# Patient Record
Sex: Male | Born: 1955 | Hispanic: No | Marital: Married | Smoking: Former smoker
Health system: Southern US, Community
[De-identification: ages and names within clinical notes are randomized; demographics above are authoritative.]

## PROBLEM LIST (undated history)

## (undated) DIAGNOSIS — K219 Gastro-esophageal reflux disease without esophagitis: Secondary | ICD-10-CM

## (undated) DIAGNOSIS — D6859 Other primary thrombophilia: Secondary | ICD-10-CM

## (undated) HISTORY — PX: ANAL FISSURE REPAIR: SHX2312

## (undated) HISTORY — PX: COLONOSCOPY: SHX174

---

## 1994-04-13 DIAGNOSIS — I82409 Acute embolism and thrombosis of unspecified deep veins of unspecified lower extremity: Secondary | ICD-10-CM

## 1994-04-13 HISTORY — DX: Acute embolism and thrombosis of unspecified deep veins of unspecified lower extremity: I82.409

## 2004-12-16 ENCOUNTER — Ambulatory Visit: Payer: Self-pay | Admitting: Internal Medicine

## 2004-12-18 ENCOUNTER — Ambulatory Visit: Payer: Self-pay | Admitting: Oncology

## 2004-12-24 ENCOUNTER — Ambulatory Visit: Payer: Self-pay | Admitting: Cardiology

## 2005-01-08 ENCOUNTER — Ambulatory Visit: Payer: Self-pay | Admitting: Cardiology

## 2005-01-20 ENCOUNTER — Ambulatory Visit: Admission: RE | Admit: 2005-01-20 | Discharge: 2005-01-20 | Payer: Self-pay | Admitting: Oncology

## 2005-02-09 ENCOUNTER — Ambulatory Visit: Payer: Self-pay | Admitting: Oncology

## 2005-03-02 ENCOUNTER — Ambulatory Visit: Payer: Self-pay

## 2005-03-20 ENCOUNTER — Ambulatory Visit: Payer: Self-pay | Admitting: Cardiology

## 2005-04-23 ENCOUNTER — Ambulatory Visit: Payer: Self-pay | Admitting: Cardiology

## 2005-05-05 ENCOUNTER — Ambulatory Visit: Payer: Self-pay | Admitting: Oncology

## 2005-06-30 ENCOUNTER — Ambulatory Visit: Payer: Self-pay | Admitting: Oncology

## 2005-07-14 LAB — CBC WITH DIFFERENTIAL/PLATELET
BASO%: 0.8 % (ref 0.0–2.0)
Basophils Absolute: 0 10*3/uL (ref 0.0–0.1)
EOS%: 3.6 % (ref 0.0–7.0)
HCT: 43.3 % (ref 38.7–49.9)
MCH: 30.7 pg (ref 28.0–33.4)
MCHC: 35.3 g/dL (ref 32.0–35.9)
MCV: 86.9 fL (ref 81.6–98.0)
MONO%: 6.2 % (ref 0.0–13.0)
NEUT%: 59.2 % (ref 40.0–75.0)
lymph#: 1.9 10*3/uL (ref 0.9–3.3)

## 2005-07-14 LAB — PROTIME-INR

## 2005-07-28 LAB — PROTIME-INR

## 2005-08-21 ENCOUNTER — Ambulatory Visit: Payer: Self-pay | Admitting: Oncology

## 2005-08-25 LAB — CBC WITH DIFFERENTIAL/PLATELET
BASO%: 0.3 % (ref 0.0–2.0)
EOS%: 0.8 % (ref 0.0–7.0)
HCT: 44.2 % (ref 38.7–49.9)
LYMPH%: 22.8 % (ref 14.0–48.0)
MCH: 30.4 pg (ref 28.0–33.4)
MCHC: 34.8 g/dL (ref 32.0–35.9)
MONO#: 0.3 10*3/uL (ref 0.1–0.9)
NEUT%: 71.4 % (ref 40.0–75.0)
Platelets: 206 10*3/uL (ref 145–400)
RBC: 5.05 10*6/uL (ref 4.20–5.71)
WBC: 7.3 10*3/uL (ref 4.0–10.0)
lymph#: 1.7 10*3/uL (ref 0.9–3.3)

## 2005-08-25 LAB — PROTIME-INR: Protime: 23.6 Seconds — ABNORMAL HIGH (ref 10.6–13.4)

## 2005-09-14 LAB — PROTIME-INR: INR: 3.4 (ref 2.00–3.50)

## 2005-09-14 LAB — CBC WITH DIFFERENTIAL/PLATELET
BASO%: 1.1 % (ref 0.0–2.0)
Eosinophils Absolute: 0.1 10*3/uL (ref 0.0–0.5)
HCT: 45.2 % (ref 38.7–49.9)
HGB: 15.4 g/dL (ref 13.0–17.1)
MCHC: 34.2 g/dL (ref 32.0–35.9)
MONO#: 0.3 10*3/uL (ref 0.1–0.9)
NEUT#: 3.3 10*3/uL (ref 1.5–6.5)
NEUT%: 60.4 % (ref 40.0–75.0)
WBC: 5.4 10*3/uL (ref 4.0–10.0)
lymph#: 1.6 10*3/uL (ref 0.9–3.3)

## 2005-09-18 LAB — PROTIME-INR: Protime: 21 Seconds — ABNORMAL HIGH (ref 10.6–13.4)

## 2005-10-12 ENCOUNTER — Ambulatory Visit: Payer: Self-pay | Admitting: Oncology

## 2005-10-20 LAB — CBC WITH DIFFERENTIAL/PLATELET
BASO%: 0.5 % (ref 0.0–2.0)
Basophils Absolute: 0 10*3/uL (ref 0.0–0.1)
EOS%: 1 % (ref 0.0–7.0)
Eosinophils Absolute: 0.1 10*3/uL (ref 0.0–0.5)
HCT: 43.1 % (ref 38.7–49.9)
HGB: 14.8 g/dL (ref 13.0–17.1)
LYMPH%: 31.3 % (ref 14.0–48.0)
MCHC: 34.4 g/dL (ref 32.0–35.9)
MONO#: 0.3 10*3/uL (ref 0.1–0.9)
MONO%: 5.2 % (ref 0.0–13.0)
NEUT#: 3.5 10*3/uL (ref 1.5–6.5)
NEUT%: 62 % (ref 40.0–75.0)
Platelets: 175 10*3/uL (ref 145–400)
WBC: 5.7 10*3/uL (ref 4.0–10.0)

## 2005-10-20 LAB — PROTIME-INR
INR: 3.2 (ref 2.00–3.50)
Protime: 21.6 Seconds — ABNORMAL HIGH (ref 10.6–13.4)

## 2005-11-09 LAB — PROTIME-INR

## 2005-11-16 LAB — PROTIME-INR
INR: 2.3 (ref 2.00–3.50)
Protime: 18.4 Seconds — ABNORMAL HIGH (ref 10.6–13.4)

## 2005-11-20 LAB — CBC WITH DIFFERENTIAL/PLATELET
BASO%: 0.8 % (ref 0.0–2.0)
Eosinophils Absolute: 0.1 10*3/uL (ref 0.0–0.5)
LYMPH%: 28.9 % (ref 14.0–48.0)
MCHC: 34.8 g/dL (ref 32.0–35.9)
MONO#: 0.4 10*3/uL (ref 0.1–0.9)
NEUT#: 3.9 10*3/uL (ref 1.5–6.5)
Platelets: 191 10*3/uL (ref 145–400)
RBC: 4.96 10*6/uL (ref 4.20–5.71)
RDW: 10.8 % — ABNORMAL LOW (ref 11.2–14.6)
WBC: 6.3 10*3/uL (ref 4.0–10.0)
lymph#: 1.8 10*3/uL (ref 0.9–3.3)

## 2005-11-20 LAB — PROTIME-INR
INR: 2.2 (ref 2.00–3.50)
Protime: 18.3 Seconds (ref 10.6–13.4)

## 2005-12-18 ENCOUNTER — Ambulatory Visit: Payer: Self-pay | Admitting: Oncology

## 2005-12-18 LAB — CBC WITH DIFFERENTIAL/PLATELET
BASO%: 1.7 % (ref 0.0–2.0)
LYMPH%: 27.8 % (ref 14.0–48.0)
MCHC: 35.8 g/dL (ref 32.0–35.9)
MCV: 85.2 fL (ref 81.6–98.0)
MONO#: 0.4 10*3/uL (ref 0.1–0.9)
MONO%: 6 % (ref 0.0–13.0)
Platelets: 185 10*3/uL (ref 145–400)
RBC: 5.16 10*6/uL (ref 4.20–5.71)
RDW: 10.8 % — ABNORMAL LOW (ref 11.2–14.6)
WBC: 6.1 10*3/uL (ref 4.0–10.0)

## 2006-02-10 ENCOUNTER — Ambulatory Visit: Payer: Self-pay | Admitting: Oncology

## 2006-02-17 LAB — CBC WITH DIFFERENTIAL/PLATELET
Basophils Absolute: 0.1 10*3/uL (ref 0.0–0.1)
Eosinophils Absolute: 0.2 10*3/uL (ref 0.0–0.5)
HGB: 15.2 g/dL (ref 13.0–17.1)
MCV: 84.8 fL (ref 81.6–98.0)
MONO#: 0.4 10*3/uL (ref 0.1–0.9)
MONO%: 7.4 % (ref 0.0–13.0)
NEUT#: 2.9 10*3/uL (ref 1.5–6.5)
RBC: 5.03 10*6/uL (ref 4.20–5.71)
RDW: 10.7 % — ABNORMAL LOW (ref 11.2–14.6)
WBC: 5.5 10*3/uL (ref 4.0–10.0)
lymph#: 1.9 10*3/uL (ref 0.9–3.3)

## 2006-02-17 LAB — PROTIME-INR

## 2006-03-17 LAB — CBC WITH DIFFERENTIAL/PLATELET
Basophils Absolute: 0 10*3/uL (ref 0.0–0.1)
Eosinophils Absolute: 0.2 10*3/uL (ref 0.0–0.5)
HGB: 15.6 g/dL (ref 13.0–17.1)
LYMPH%: 29.5 % (ref 14.0–48.0)
MCV: 87.3 fL (ref 81.6–98.0)
MONO#: 0.3 10*3/uL (ref 0.1–0.9)
MONO%: 5.4 % (ref 0.0–13.0)
NEUT#: 3.4 10*3/uL (ref 1.5–6.5)
Platelets: 188 10*3/uL (ref 145–400)
RDW: 12.5 % (ref 11.2–14.6)
WBC: 5.5 10*3/uL (ref 4.0–10.0)

## 2006-03-17 LAB — PROTIME-INR: INR: 2.2 (ref 2.00–3.50)

## 2006-04-04 ENCOUNTER — Ambulatory Visit: Payer: Self-pay | Admitting: Oncology

## 2006-04-08 LAB — CBC WITH DIFFERENTIAL/PLATELET
Basophils Absolute: 0.1 10*3/uL (ref 0.0–0.1)
Eosinophils Absolute: 0.1 10*3/uL (ref 0.0–0.5)
HGB: 16.1 g/dL (ref 13.0–17.1)
MCV: 84.7 fL (ref 81.6–98.0)
MONO#: 0.5 10*3/uL (ref 0.1–0.9)
MONO%: 6.3 % (ref 0.0–13.0)
NEUT#: 5 10*3/uL (ref 1.5–6.5)
RBC: 5.29 10*6/uL (ref 4.20–5.71)
RDW: 10.6 % — ABNORMAL LOW (ref 11.2–14.6)
WBC: 7.7 10*3/uL (ref 4.0–10.0)

## 2006-04-08 LAB — PROTIME-INR
INR: 1.8 — ABNORMAL LOW (ref 2.00–3.50)
Protime: 21.6 Seconds — ABNORMAL HIGH (ref 10.6–13.4)

## 2006-04-16 LAB — PROTIME-INR
INR: 1.7 — ABNORMAL LOW (ref 2.00–3.50)
Protime: 20.4 Seconds — ABNORMAL HIGH (ref 10.6–13.4)

## 2006-04-30 LAB — PROTIME-INR
INR: 2 (ref 2.00–3.50)
Protime: 24 Seconds — ABNORMAL HIGH (ref 10.6–13.4)

## 2006-05-25 ENCOUNTER — Ambulatory Visit: Payer: Self-pay | Admitting: Oncology

## 2006-05-28 LAB — CBC WITH DIFFERENTIAL/PLATELET
BASO%: 0.9 % (ref 0.0–2.0)
EOS%: 2.3 % (ref 0.0–7.0)
MCH: 31 pg (ref 28.0–33.4)
MCHC: 35.9 g/dL (ref 32.0–35.9)
NEUT%: 57.6 % (ref 40.0–75.0)
RDW: 10.4 % — ABNORMAL LOW (ref 11.2–14.6)
lymph#: 2.2 10*3/uL (ref 0.9–3.3)

## 2006-05-28 LAB — PROTIME-INR

## 2006-07-16 ENCOUNTER — Ambulatory Visit: Payer: Self-pay | Admitting: Oncology

## 2006-07-16 LAB — CBC WITH DIFFERENTIAL/PLATELET
BASO%: 1.2 % (ref 0.0–2.0)
Eosinophils Absolute: 0.3 10*3/uL (ref 0.0–0.5)
HCT: 46.6 % (ref 38.7–49.9)
LYMPH%: 35.1 % (ref 14.0–48.0)
MCHC: 34.9 g/dL (ref 32.0–35.9)
MONO#: 0.5 10*3/uL (ref 0.1–0.9)
NEUT%: 50.4 % (ref 40.0–75.0)
Platelets: 197 10*3/uL (ref 145–400)
WBC: 5.9 10*3/uL (ref 4.0–10.0)

## 2006-08-20 LAB — CBC WITH DIFFERENTIAL/PLATELET
EOS%: 3.5 % (ref 0.0–7.0)
MCH: 29.8 pg (ref 28.0–33.4)
MCV: 84.9 fL (ref 81.6–98.0)
MONO%: 5.8 % (ref 0.0–13.0)
NEUT#: 2.4 10*3/uL (ref 1.5–6.5)
RBC: 5.15 10*6/uL (ref 4.20–5.71)
RDW: 10.2 % — ABNORMAL LOW (ref 11.2–14.6)

## 2006-08-20 LAB — PROTIME-INR
INR: 3.3 (ref 2.00–3.50)
Protime: 39.6 Seconds — ABNORMAL HIGH (ref 10.6–13.4)

## 2006-09-01 ENCOUNTER — Ambulatory Visit: Payer: Self-pay | Admitting: Oncology

## 2006-09-24 LAB — CBC WITH DIFFERENTIAL/PLATELET
BASO%: 1.1 % (ref 0.0–2.0)
Basophils Absolute: 0.1 10*3/uL (ref 0.0–0.1)
HCT: 41.5 % (ref 38.7–49.9)
HGB: 15 g/dL (ref 13.0–17.1)
MONO#: 0.4 10*3/uL (ref 0.1–0.9)
NEUT%: 57.8 % (ref 40.0–75.0)
WBC: 5.1 10*3/uL (ref 4.0–10.0)
lymph#: 1.6 10*3/uL (ref 0.9–3.3)

## 2006-09-24 LAB — PROTIME-INR

## 2006-10-20 ENCOUNTER — Ambulatory Visit: Payer: Self-pay | Admitting: Oncology

## 2006-10-22 LAB — CBC WITH DIFFERENTIAL/PLATELET
Basophils Absolute: 0 10*3/uL (ref 0.0–0.1)
EOS%: 3.1 % (ref 0.0–7.0)
Eosinophils Absolute: 0.2 10*3/uL (ref 0.0–0.5)
HCT: 45.1 % (ref 38.7–49.9)
HGB: 15.8 g/dL (ref 13.0–17.1)
MCH: 30.5 pg (ref 28.0–33.4)
MCV: 86.9 fL (ref 81.6–98.0)
NEUT%: 64.6 % (ref 40.0–75.0)
lymph#: 1.6 10*3/uL (ref 0.9–3.3)

## 2006-10-22 LAB — PROTIME-INR: INR: 2.1 (ref 2.00–3.50)

## 2006-11-23 LAB — CBC WITH DIFFERENTIAL/PLATELET
Basophils Absolute: 0.1 10*3/uL (ref 0.0–0.1)
Eosinophils Absolute: 0.1 10*3/uL (ref 0.0–0.5)
HGB: 15.9 g/dL (ref 13.0–17.1)
MCV: 84.6 fL (ref 81.6–98.0)
MONO%: 4.8 % (ref 0.0–13.0)
NEUT#: 4.9 10*3/uL (ref 1.5–6.5)
Platelets: 180 10*3/uL (ref 145–400)
RDW: 10.3 % — ABNORMAL LOW (ref 11.2–14.6)

## 2006-11-23 LAB — PROTIME-INR: INR: 2.5 (ref 2.00–3.50)

## 2006-12-15 ENCOUNTER — Ambulatory Visit: Payer: Self-pay | Admitting: Oncology

## 2006-12-23 LAB — CBC WITH DIFFERENTIAL/PLATELET
Basophils Absolute: 0 10*3/uL (ref 0.0–0.1)
Eosinophils Absolute: 0.2 10*3/uL (ref 0.0–0.5)
HGB: 16.2 g/dL (ref 13.0–17.1)
MCV: 85.6 fL (ref 81.6–98.0)
MONO#: 0.4 10*3/uL (ref 0.1–0.9)
NEUT#: 4.1 10*3/uL (ref 1.5–6.5)
RDW: 10.2 % — ABNORMAL LOW (ref 11.2–14.6)
WBC: 6.9 10*3/uL (ref 4.0–10.0)
lymph#: 2.1 10*3/uL (ref 0.9–3.3)

## 2006-12-23 LAB — PROTIME-INR: INR: 2.4 (ref 2.00–3.50)

## 2007-01-21 LAB — CBC WITH DIFFERENTIAL/PLATELET
BASO%: 1 % (ref 0.0–2.0)
Eosinophils Absolute: 0.2 10*3/uL (ref 0.0–0.5)
MONO#: 0.4 10*3/uL (ref 0.1–0.9)
NEUT#: 3.6 10*3/uL (ref 1.5–6.5)
RBC: 4.88 10*6/uL (ref 4.20–5.71)
RDW: 10.1 % — ABNORMAL LOW (ref 11.2–14.6)
WBC: 6.4 10*3/uL (ref 4.0–10.0)

## 2007-01-21 LAB — PROTIME-INR
INR: 2.8 (ref 2.00–3.50)
Protime: 33.6 Seconds — ABNORMAL HIGH (ref 10.6–13.4)

## 2007-02-11 ENCOUNTER — Ambulatory Visit: Payer: Self-pay | Admitting: Oncology

## 2007-02-16 LAB — CBC WITH DIFFERENTIAL/PLATELET
BASO%: 1 % (ref 0.0–2.0)
Eosinophils Absolute: 0.3 10*3/uL (ref 0.0–0.5)
MCHC: 35.5 g/dL (ref 32.0–35.9)
MCV: 85.6 fL (ref 81.6–98.0)
MONO#: 0.4 10*3/uL (ref 0.1–0.9)
MONO%: 5.8 % (ref 0.0–13.0)
NEUT#: 3.9 10*3/uL (ref 1.5–6.5)
RBC: 5.2 10*6/uL (ref 4.20–5.71)
RDW: 10.1 % — ABNORMAL LOW (ref 11.2–14.6)
WBC: 6.7 10*3/uL (ref 4.0–10.0)

## 2007-02-16 LAB — PROTIME-INR
INR: 2.9 (ref 2.00–3.50)
Protime: 34.8 Seconds — ABNORMAL HIGH (ref 10.6–13.4)

## 2007-03-22 LAB — CBC WITH DIFFERENTIAL/PLATELET
BASO%: 1.4 % (ref 0.0–2.0)
EOS%: 3.6 % (ref 0.0–7.0)
Eosinophils Absolute: 0.2 10*3/uL (ref 0.0–0.5)
LYMPH%: 29.6 % (ref 14.0–48.0)
MCHC: 36.8 g/dL — ABNORMAL HIGH (ref 32.0–35.9)
MCV: 84.2 fL (ref 81.6–98.0)
MONO%: 5.7 % (ref 0.0–13.0)
NEUT#: 3.6 10*3/uL (ref 1.5–6.5)
Platelets: 209 10*3/uL (ref 145–400)
RBC: 5.07 10*6/uL (ref 4.20–5.71)
RDW: 10.1 % — ABNORMAL LOW (ref 11.2–14.6)

## 2007-03-22 LAB — PROTIME-INR
INR: 2.2 (ref 2.00–3.50)
Protime: 26.4 Seconds — ABNORMAL HIGH (ref 10.6–13.4)

## 2007-04-15 ENCOUNTER — Encounter: Admission: RE | Admit: 2007-04-15 | Discharge: 2007-04-15 | Payer: Self-pay | Admitting: Internal Medicine

## 2007-04-19 ENCOUNTER — Ambulatory Visit: Payer: Self-pay | Admitting: Oncology

## 2007-04-21 LAB — PROTIME-INR: INR: 3 (ref 2.00–3.50)

## 2007-04-21 LAB — CBC WITH DIFFERENTIAL/PLATELET
BASO%: 1.1 % (ref 0.0–2.0)
EOS%: 3.2 % (ref 0.0–7.0)
MCH: 30.6 pg (ref 28.0–33.4)
MCHC: 36.3 g/dL — ABNORMAL HIGH (ref 32.0–35.9)
MONO%: 5.9 % (ref 0.0–13.0)
RDW: 9.8 % — ABNORMAL LOW (ref 11.2–14.6)
lymph#: 2.1 10*3/uL (ref 0.9–3.3)

## 2007-05-06 ENCOUNTER — Encounter: Admission: RE | Admit: 2007-05-06 | Discharge: 2007-05-06 | Payer: Self-pay | Admitting: Internal Medicine

## 2007-05-25 LAB — CBC WITH DIFFERENTIAL/PLATELET
Basophils Absolute: 0.1 10*3/uL (ref 0.0–0.1)
Eosinophils Absolute: 0.2 10*3/uL (ref 0.0–0.5)
HGB: 15.5 g/dL (ref 13.0–17.1)
LYMPH%: 32.6 % (ref 14.0–48.0)
MONO#: 0.4 10*3/uL (ref 0.1–0.9)
NEUT#: 3 10*3/uL (ref 1.5–6.5)
Platelets: 195 10*3/uL (ref 145–400)
RBC: 5.09 10*6/uL (ref 4.20–5.71)
RDW: 9.6 % — ABNORMAL LOW (ref 11.2–14.6)
WBC: 5.3 10*3/uL (ref 4.0–10.0)

## 2007-06-09 ENCOUNTER — Ambulatory Visit: Payer: Self-pay | Admitting: Oncology

## 2007-06-22 LAB — CBC WITH DIFFERENTIAL/PLATELET
Basophils Absolute: 0.1 10*3/uL (ref 0.0–0.1)
Eosinophils Absolute: 0.1 10*3/uL (ref 0.0–0.5)
HCT: 43.2 % (ref 38.7–49.9)
HGB: 15.5 g/dL (ref 13.0–17.1)
LYMPH%: 21.9 % (ref 14.0–48.0)
MCV: 85.7 fL (ref 81.6–98.0)
MONO#: 0.3 10*3/uL (ref 0.1–0.9)
MONO%: 4.3 % (ref 0.0–13.0)
NEUT#: 5.2 10*3/uL (ref 1.5–6.5)
Platelets: 159 10*3/uL (ref 145–400)
RBC: 5.04 10*6/uL (ref 4.20–5.71)
WBC: 7.3 10*3/uL (ref 4.0–10.0)

## 2007-06-22 LAB — PROTIME-INR: Protime: 25.2 Seconds — ABNORMAL HIGH (ref 10.6–13.4)

## 2007-07-15 ENCOUNTER — Encounter: Payer: Self-pay | Admitting: Internal Medicine

## 2007-07-15 LAB — CBC WITH DIFFERENTIAL/PLATELET
Basophils Absolute: 0.1 10*3/uL (ref 0.0–0.1)
Eosinophils Absolute: 0.2 10*3/uL (ref 0.0–0.5)
HCT: 44.3 % (ref 38.7–49.9)
LYMPH%: 31.3 % (ref 14.0–48.0)
MCV: 85.3 fL (ref 81.6–98.0)
MONO#: 0.5 10*3/uL (ref 0.1–0.9)
MONO%: 6.8 % (ref 0.0–13.0)
NEUT#: 4 10*3/uL (ref 1.5–6.5)
NEUT%: 57.2 % (ref 40.0–75.0)
Platelets: 192 10*3/uL (ref 145–400)
RBC: 5.19 10*6/uL (ref 4.20–5.71)
WBC: 7.1 10*3/uL (ref 4.0–10.0)

## 2007-07-15 LAB — PROTIME-INR: Protime: 28.8 Seconds — ABNORMAL HIGH (ref 10.6–13.4)

## 2007-08-16 ENCOUNTER — Ambulatory Visit: Payer: Self-pay | Admitting: Oncology

## 2007-08-25 ENCOUNTER — Ambulatory Visit: Payer: Self-pay | Admitting: Vascular Surgery

## 2007-09-09 LAB — PROTIME-INR
INR: 4.7 — ABNORMAL HIGH (ref 2.00–3.50)
Protime: 56.4 Seconds — ABNORMAL HIGH (ref 10.6–13.4)

## 2007-09-16 LAB — PROTIME-INR

## 2007-09-21 LAB — PROTIME-INR
INR: 3.2 (ref 2.00–3.50)
Protime: 38.4 s — ABNORMAL HIGH (ref 10.6–13.4)

## 2007-10-24 ENCOUNTER — Ambulatory Visit: Payer: Self-pay | Admitting: Oncology

## 2007-10-26 LAB — PROTIME-INR
INR: 2.9 (ref 2.00–3.50)
Protime: 34.8 Seconds — ABNORMAL HIGH (ref 10.6–13.4)

## 2007-11-02 LAB — PROTIME-INR: INR: 2.4 (ref 2.00–3.50)

## 2007-11-16 LAB — PROTIME-INR

## 2007-12-07 LAB — PROTIME-INR: INR: 2.4 (ref 2.00–3.50)

## 2008-01-11 ENCOUNTER — Ambulatory Visit: Payer: Self-pay | Admitting: Oncology

## 2008-01-13 ENCOUNTER — Encounter: Payer: Self-pay | Admitting: Internal Medicine

## 2008-01-13 LAB — PROTIME-INR
INR: 2.8 (ref 2.00–3.50)
Protime: 33.6 Seconds — ABNORMAL HIGH (ref 10.6–13.4)

## 2008-02-29 ENCOUNTER — Ambulatory Visit: Payer: Self-pay | Admitting: Oncology

## 2008-02-29 LAB — PROTIME-INR
INR: 2.6 (ref 2.00–3.50)
Protime: 31.2 Seconds — ABNORMAL HIGH (ref 10.6–13.4)

## 2008-04-02 LAB — CBC WITH DIFFERENTIAL/PLATELET
Eosinophils Absolute: 0.2 10*3/uL (ref 0.0–0.5)
HCT: 43.4 % (ref 38.7–49.9)
HGB: 15.4 g/dL (ref 13.0–17.1)
LYMPH%: 30.2 % (ref 14.0–48.0)
MONO#: 0.6 10*3/uL (ref 0.1–0.9)
NEUT#: 4 10*3/uL (ref 1.5–6.5)
NEUT%: 57.4 % (ref 40.0–75.0)
Platelets: 177 10*3/uL (ref 145–400)
WBC: 6.9 10*3/uL (ref 4.0–10.0)
lymph#: 2.1 10*3/uL (ref 0.9–3.3)

## 2008-04-02 LAB — PROTIME-INR

## 2008-04-10 ENCOUNTER — Ambulatory Visit: Payer: Self-pay | Admitting: Oncology

## 2008-04-12 LAB — CBC WITH DIFFERENTIAL/PLATELET
Eosinophils Absolute: 0.2 10*3/uL (ref 0.0–0.5)
MONO#: 0.3 10*3/uL (ref 0.1–0.9)
NEUT#: 3.2 10*3/uL (ref 1.5–6.5)
RBC: 5.03 10*6/uL (ref 4.20–5.71)
RDW: 11.9 % (ref 11.2–14.6)
WBC: 5.4 10*3/uL (ref 4.0–10.0)

## 2008-04-12 LAB — PROTIME-INR
INR: 1.2 — ABNORMAL LOW (ref 2.00–3.50)
Protime: 14.4 Seconds — ABNORMAL HIGH (ref 10.6–13.4)

## 2008-04-17 LAB — PROTIME-INR
INR: 1.6 — ABNORMAL LOW (ref 2.00–3.50)
Protime: 19.2 Seconds — ABNORMAL HIGH (ref 10.6–13.4)

## 2008-04-20 LAB — PROTIME-INR

## 2008-04-30 LAB — PROTIME-INR

## 2008-05-16 LAB — PROTIME-INR: Protime: 33.6 Seconds — ABNORMAL HIGH (ref 10.6–13.4)

## 2008-05-24 ENCOUNTER — Ambulatory Visit: Payer: Self-pay | Admitting: Oncology

## 2008-05-28 LAB — PROTIME-INR: INR: 3.1 (ref 2.00–3.50)

## 2008-06-09 IMAGING — US US ABDOMEN COMPLETE
1 series · 14 of 25 positions shown · non-contrast
Comparison: none

CLINICAL DATA: Right upper quadrant pain with eating. 
 COMPLETE ABDOMINAL ULTRASOUND:
TECHNIQUE: Complete abdominal ultrasound examination was performed including evaluation of the liver, gallbladder, bile ducts, pancreas, kidneys, spleen, IVC, and abdominal aorta.

[Series 1: us abdomen complete · 0.33mm/px · 14 of 72 slices shown]
[im 1/72]
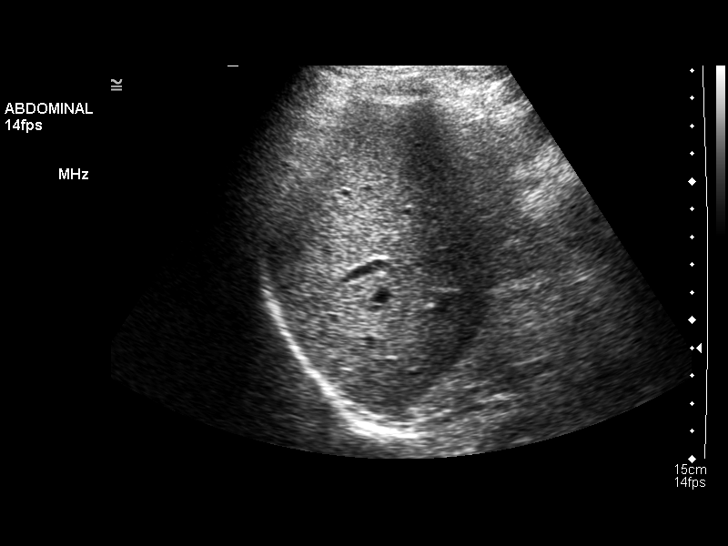
[im 6/72]
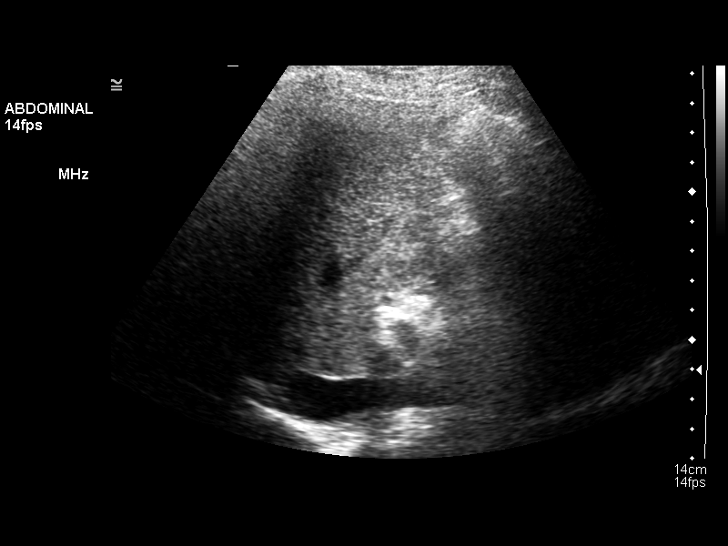
[im 12/72]
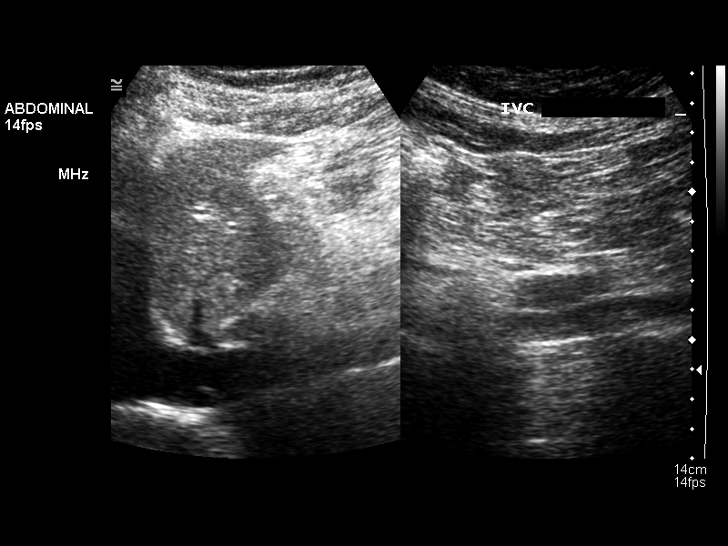
[im 18/72]
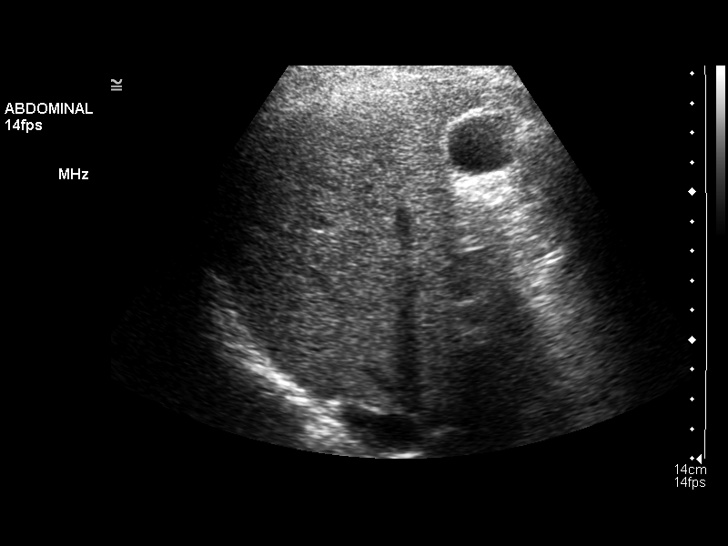
[im 24/72]
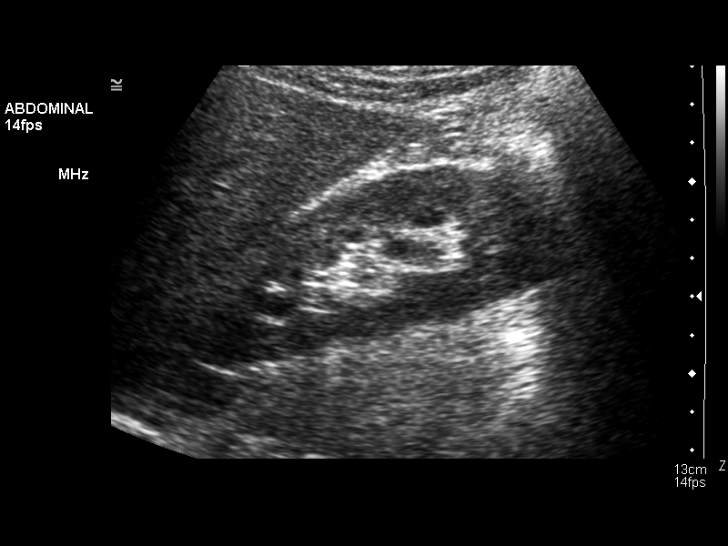
[im 27/72]
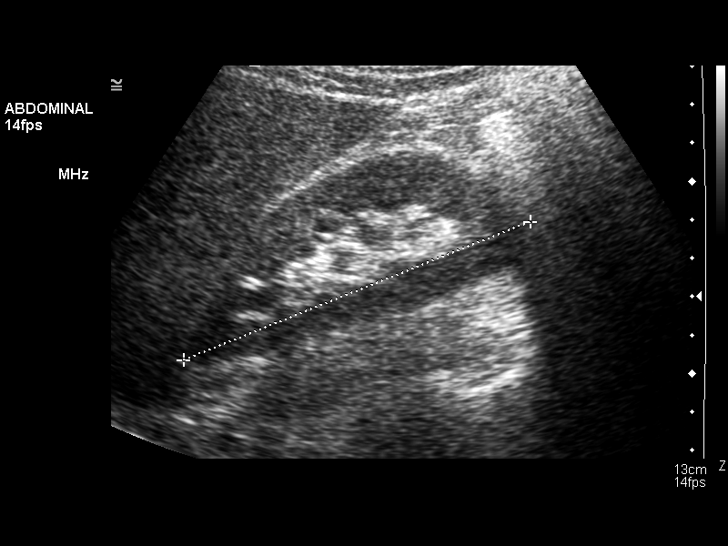
[im 33/72]
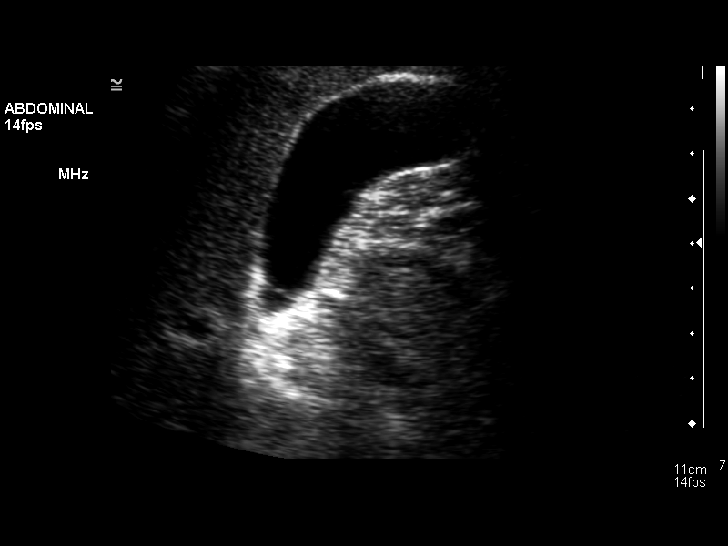
[im 39/72]
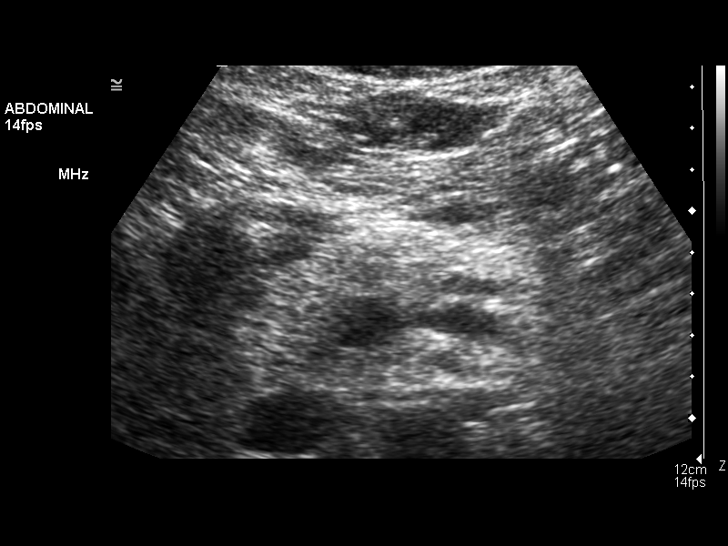
[im 45/72]
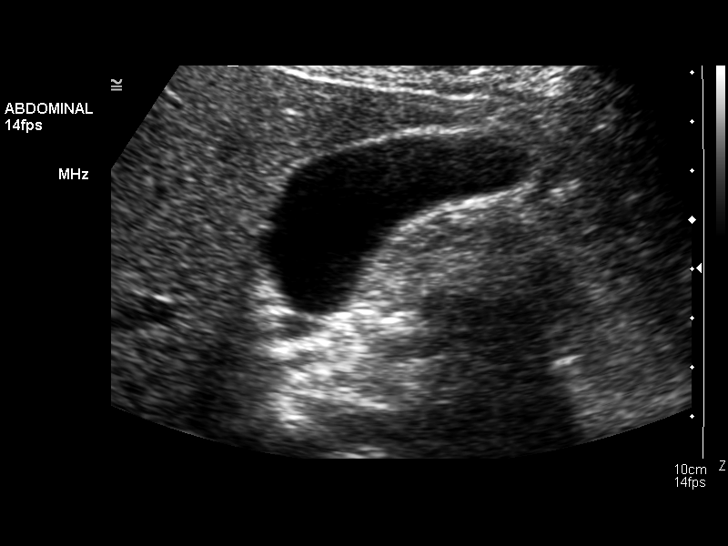
[im 48/72]
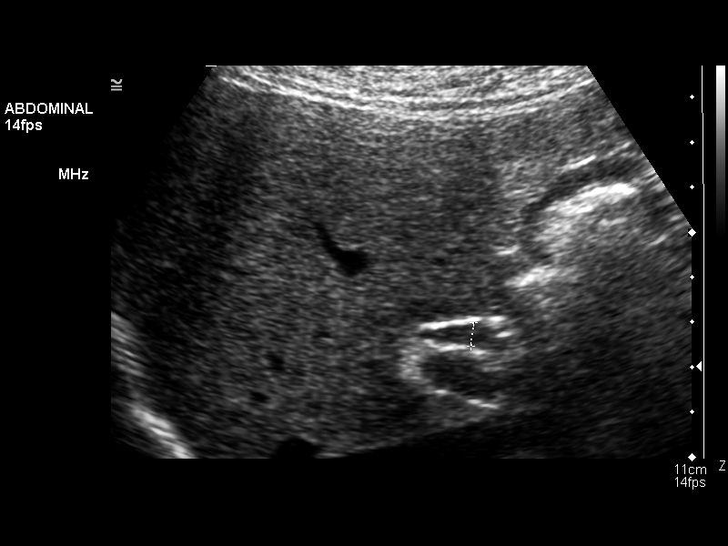
[im 54/72]
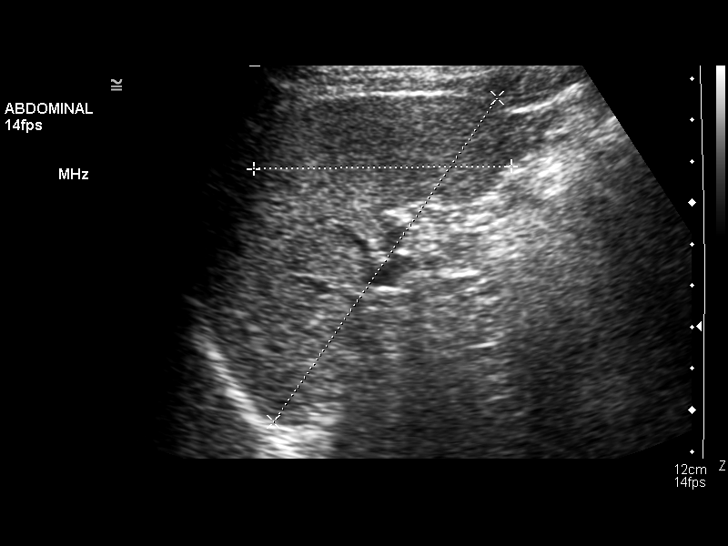
[im 60/72]
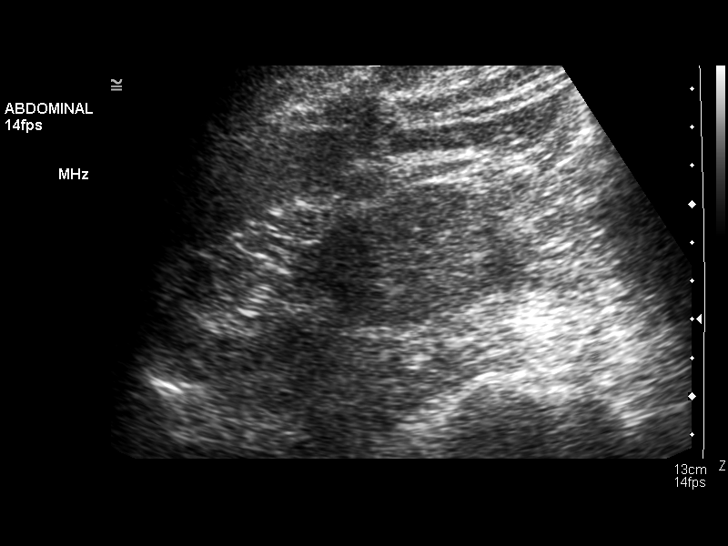
[im 66/72]
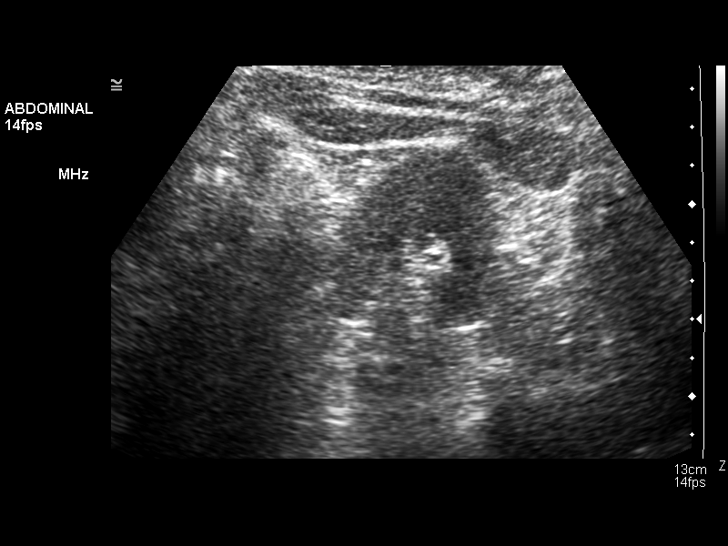
[im 72/72]
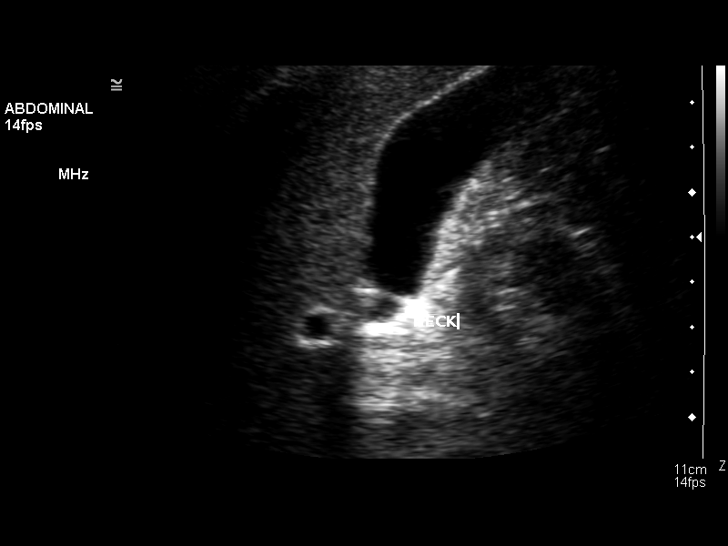

[14 of 25 positions shown; findings below may reference images not displayed]

FINDINGS: Normal gallbladder.  Gallbladder wall thickness is 0.8mm.  The common duct measures 4 to 5mm in diameter which is within normal limits.  No hepatic, pancreatic, splenic, or renal abnormality.  The spleen measures 6.2cm x 9.5cm.  Right and left kidneys measure 10.6cm and 11.2cm in length, respectively.  Patent IVC.  Abdominal aorta maximum diameter is 1.9cm.
IMPRESSION: Negative abdominal ultrasound.

## 2008-06-13 LAB — PROTIME-INR
INR: 2.1 (ref 2.00–3.50)
Protime: 25.2 Seconds — ABNORMAL HIGH (ref 10.6–13.4)

## 2008-07-10 ENCOUNTER — Ambulatory Visit: Payer: Self-pay | Admitting: Oncology

## 2008-07-13 LAB — CBC WITH DIFFERENTIAL/PLATELET
Basophils Absolute: 0 10*3/uL (ref 0.0–0.1)
EOS%: 3.4 % (ref 0.0–7.0)
HGB: 15.3 g/dL (ref 13.0–17.1)
LYMPH%: 27.6 % (ref 14.0–49.0)
MCH: 29.4 pg (ref 27.2–33.4)
MCV: 82.7 fL (ref 79.3–98.0)
MONO%: 7.4 % (ref 0.0–14.0)
RDW: 12.2 % (ref 11.0–14.6)

## 2008-07-13 LAB — PROTIME-INR: INR: 3.2 (ref 2.00–3.50)

## 2008-08-09 LAB — PROTIME-INR

## 2008-08-29 ENCOUNTER — Ambulatory Visit (HOSPITAL_COMMUNITY): Admission: RE | Admit: 2008-08-29 | Discharge: 2008-08-29 | Payer: Self-pay | Admitting: Gastroenterology

## 2008-09-05 ENCOUNTER — Ambulatory Visit: Payer: Self-pay | Admitting: Oncology

## 2008-09-07 LAB — PROTIME-INR: Protime: 31.2 Seconds — ABNORMAL HIGH (ref 10.6–13.4)

## 2008-10-05 LAB — CBC WITH DIFFERENTIAL/PLATELET
Eosinophils Absolute: 0.1 10*3/uL (ref 0.0–0.5)
LYMPH%: 20.4 % (ref 14.0–49.0)
MCV: 86.4 fL (ref 79.3–98.0)
MONO%: 4.8 % (ref 0.0–14.0)
NEUT#: 5.4 10*3/uL (ref 1.5–6.5)
Platelets: 183 10*3/uL (ref 140–400)
RBC: 5.01 10*6/uL (ref 4.20–5.82)

## 2008-10-05 LAB — PROTIME-INR: Protime: 38.4 Seconds — ABNORMAL HIGH (ref 10.6–13.4)

## 2008-10-23 ENCOUNTER — Ambulatory Visit: Payer: Self-pay | Admitting: Oncology

## 2008-11-09 LAB — PROTIME-INR: Protime: 46.8 Seconds — ABNORMAL HIGH (ref 10.6–13.4)

## 2008-11-23 ENCOUNTER — Ambulatory Visit: Payer: Self-pay | Admitting: Oncology

## 2008-11-27 LAB — PROTIME-INR

## 2009-01-09 ENCOUNTER — Ambulatory Visit: Payer: Self-pay | Admitting: Oncology

## 2009-01-11 LAB — PROTIME-INR

## 2009-02-15 ENCOUNTER — Ambulatory Visit: Payer: Self-pay | Admitting: Oncology

## 2009-02-15 LAB — CBC WITH DIFFERENTIAL/PLATELET
BASO%: 0.6 % (ref 0.0–2.0)
EOS%: 2.2 % (ref 0.0–7.0)
HCT: 42.7 % (ref 38.4–49.9)
LYMPH%: 34.4 % (ref 14.0–49.0)
MCH: 30.3 pg (ref 27.2–33.4)
MCHC: 35.4 g/dL (ref 32.0–36.0)
MONO%: 6.2 % (ref 0.0–14.0)
NEUT%: 56.6 % (ref 39.0–75.0)
Platelets: 168 10*3/uL (ref 140–400)

## 2009-03-20 ENCOUNTER — Ambulatory Visit: Payer: Self-pay | Admitting: Oncology

## 2009-03-26 LAB — PROTIME-INR: INR: 2.2 (ref 2.00–3.50)

## 2009-05-21 ENCOUNTER — Ambulatory Visit: Payer: Self-pay | Admitting: Oncology

## 2009-05-23 LAB — PROTIME-INR
INR: 2.3 (ref 2.00–3.50)
Protime: 27.6 Seconds — ABNORMAL HIGH (ref 10.6–13.4)

## 2009-07-02 ENCOUNTER — Ambulatory Visit: Payer: Self-pay | Admitting: Oncology

## 2009-07-17 LAB — CBC WITH DIFFERENTIAL/PLATELET
Basophils Absolute: 0.1 10*3/uL (ref 0.0–0.1)
EOS%: 2.3 % (ref 0.0–7.0)
Eosinophils Absolute: 0.1 10*3/uL (ref 0.0–0.5)
HGB: 15.1 g/dL (ref 13.0–17.1)
LYMPH%: 29.5 % (ref 14.0–49.0)
MCH: 30.2 pg (ref 27.2–33.4)
MCV: 86.6 fL (ref 79.3–98.0)
MONO%: 5.5 % (ref 0.0–14.0)
NEUT#: 3.2 10*3/uL (ref 1.5–6.5)
NEUT%: 61.7 % (ref 39.0–75.0)
Platelets: 151 10*3/uL (ref 140–400)

## 2009-08-01 ENCOUNTER — Ambulatory Visit: Payer: Self-pay | Admitting: Oncology

## 2009-08-02 LAB — PROTIME-INR: Protime: 19.2 Seconds — ABNORMAL HIGH (ref 10.6–13.4)

## 2009-08-07 LAB — PROTIME-INR
INR: 2.4 (ref 2.00–3.50)
Protime: 28.8 Seconds — ABNORMAL HIGH (ref 10.6–13.4)

## 2009-08-30 LAB — PROTIME-INR
INR: 2.7 (ref 2.00–3.50)
Protime: 32.4 Seconds — ABNORMAL HIGH (ref 10.6–13.4)

## 2009-10-07 ENCOUNTER — Ambulatory Visit: Payer: Self-pay | Admitting: Oncology

## 2009-10-09 LAB — PROTIME-INR: Protime: 38.4 Seconds — ABNORMAL HIGH (ref 10.6–13.4)

## 2010-01-27 ENCOUNTER — Ambulatory Visit: Payer: Self-pay | Admitting: Oncology

## 2010-01-27 LAB — PROTIME-INR: INR: 3 (ref 2.00–3.50)

## 2010-03-14 ENCOUNTER — Ambulatory Visit: Payer: Self-pay | Admitting: Oncology

## 2010-03-14 LAB — CBC WITH DIFFERENTIAL/PLATELET
Basophils Absolute: 0.1 10*3/uL (ref 0.0–0.1)
EOS%: 2.9 % (ref 0.0–7.0)
HCT: 45 % (ref 38.4–49.9)
HGB: 16 g/dL (ref 13.0–17.1)
LYMPH%: 30.5 % (ref 14.0–49.0)
MCH: 30.5 pg (ref 27.2–33.4)
NEUT%: 60.6 % (ref 39.0–75.0)
Platelets: 167 10*3/uL (ref 140–400)
lymph#: 2 10*3/uL (ref 0.9–3.3)

## 2010-03-14 LAB — PROTIME-INR

## 2010-05-05 ENCOUNTER — Encounter: Payer: Self-pay | Admitting: General Surgery

## 2010-05-28 ENCOUNTER — Other Ambulatory Visit: Payer: Self-pay | Admitting: Oncology

## 2010-05-28 ENCOUNTER — Encounter (HOSPITAL_BASED_OUTPATIENT_CLINIC_OR_DEPARTMENT_OTHER): Payer: BC Managed Care – PPO | Admitting: Oncology

## 2010-05-28 DIAGNOSIS — Z7901 Long term (current) use of anticoagulants: Secondary | ICD-10-CM

## 2010-05-28 DIAGNOSIS — D6859 Other primary thrombophilia: Secondary | ICD-10-CM

## 2010-05-28 DIAGNOSIS — Z86718 Personal history of other venous thrombosis and embolism: Secondary | ICD-10-CM

## 2010-05-28 LAB — PROTIME-INR

## 2010-08-08 ENCOUNTER — Other Ambulatory Visit: Payer: Self-pay | Admitting: Oncology

## 2010-08-08 ENCOUNTER — Encounter (HOSPITAL_BASED_OUTPATIENT_CLINIC_OR_DEPARTMENT_OTHER): Payer: BC Managed Care – PPO | Admitting: Oncology

## 2010-08-08 DIAGNOSIS — D6859 Other primary thrombophilia: Secondary | ICD-10-CM

## 2010-08-08 DIAGNOSIS — Z5181 Encounter for therapeutic drug level monitoring: Secondary | ICD-10-CM

## 2010-08-08 DIAGNOSIS — Z7901 Long term (current) use of anticoagulants: Secondary | ICD-10-CM

## 2010-08-08 DIAGNOSIS — Z86718 Personal history of other venous thrombosis and embolism: Secondary | ICD-10-CM

## 2010-08-08 LAB — PROTIME-INR: INR: 2.5 (ref 2.00–3.50)

## 2010-09-19 ENCOUNTER — Other Ambulatory Visit: Payer: Self-pay | Admitting: Oncology

## 2010-09-19 ENCOUNTER — Encounter (HOSPITAL_BASED_OUTPATIENT_CLINIC_OR_DEPARTMENT_OTHER): Payer: BC Managed Care – PPO | Admitting: Oncology

## 2010-09-19 DIAGNOSIS — Z5181 Encounter for therapeutic drug level monitoring: Secondary | ICD-10-CM

## 2010-09-19 DIAGNOSIS — Z86718 Personal history of other venous thrombosis and embolism: Secondary | ICD-10-CM

## 2010-09-19 DIAGNOSIS — I821 Thrombophlebitis migrans: Secondary | ICD-10-CM

## 2010-09-19 DIAGNOSIS — Z7901 Long term (current) use of anticoagulants: Secondary | ICD-10-CM

## 2010-09-19 DIAGNOSIS — D6859 Other primary thrombophilia: Secondary | ICD-10-CM

## 2010-09-19 LAB — PROTIME-INR: INR: 2.7 (ref 2.00–3.50)

## 2010-09-25 ENCOUNTER — Other Ambulatory Visit: Payer: Self-pay | Admitting: Internal Medicine

## 2010-09-25 DIAGNOSIS — M545 Low back pain: Secondary | ICD-10-CM

## 2010-09-29 ENCOUNTER — Ambulatory Visit
Admission: RE | Admit: 2010-09-29 | Discharge: 2010-09-29 | Disposition: A | Discharge status: Home / Self Care | Payer: BC Managed Care – PPO | Source: Ambulatory Visit | Attending: Internal Medicine | Admitting: Internal Medicine

## 2010-09-29 DIAGNOSIS — M545 Low back pain: Secondary | ICD-10-CM

## 2010-11-13 ENCOUNTER — Encounter (HOSPITAL_BASED_OUTPATIENT_CLINIC_OR_DEPARTMENT_OTHER): Payer: BC Managed Care – PPO | Admitting: Oncology

## 2010-11-13 ENCOUNTER — Other Ambulatory Visit: Payer: Self-pay | Admitting: Oncology

## 2010-11-13 DIAGNOSIS — R109 Unspecified abdominal pain: Secondary | ICD-10-CM

## 2010-11-13 DIAGNOSIS — D6859 Other primary thrombophilia: Secondary | ICD-10-CM

## 2010-11-13 DIAGNOSIS — Z86718 Personal history of other venous thrombosis and embolism: Secondary | ICD-10-CM

## 2010-11-13 DIAGNOSIS — Z7901 Long term (current) use of anticoagulants: Secondary | ICD-10-CM

## 2010-11-13 LAB — PROTIME-INR

## 2010-11-20 ENCOUNTER — Encounter (HOSPITAL_BASED_OUTPATIENT_CLINIC_OR_DEPARTMENT_OTHER): Payer: BC Managed Care – PPO | Admitting: Oncology

## 2010-11-20 ENCOUNTER — Other Ambulatory Visit: Payer: Self-pay | Admitting: Oncology

## 2010-11-20 DIAGNOSIS — D6859 Other primary thrombophilia: Secondary | ICD-10-CM

## 2010-11-20 DIAGNOSIS — Z7901 Long term (current) use of anticoagulants: Secondary | ICD-10-CM

## 2010-11-20 DIAGNOSIS — Z86718 Personal history of other venous thrombosis and embolism: Secondary | ICD-10-CM

## 2010-11-20 LAB — PROTIME-INR: INR: 2.4 (ref 2.00–3.50)

## 2011-01-19 ENCOUNTER — Encounter (HOSPITAL_BASED_OUTPATIENT_CLINIC_OR_DEPARTMENT_OTHER): Payer: BC Managed Care – PPO | Admitting: Oncology

## 2011-01-19 ENCOUNTER — Other Ambulatory Visit: Payer: Self-pay | Admitting: Oncology

## 2011-01-19 DIAGNOSIS — R109 Unspecified abdominal pain: Secondary | ICD-10-CM

## 2011-01-19 DIAGNOSIS — D6859 Other primary thrombophilia: Secondary | ICD-10-CM

## 2011-01-19 DIAGNOSIS — Z86718 Personal history of other venous thrombosis and embolism: Secondary | ICD-10-CM

## 2011-01-19 DIAGNOSIS — Z7901 Long term (current) use of anticoagulants: Secondary | ICD-10-CM

## 2011-03-20 ENCOUNTER — Other Ambulatory Visit: Payer: Self-pay | Admitting: Pharmacist

## 2011-03-20 DIAGNOSIS — Z7901 Long term (current) use of anticoagulants: Secondary | ICD-10-CM | POA: Insufficient documentation

## 2011-03-20 DIAGNOSIS — D6859 Other primary thrombophilia: Secondary | ICD-10-CM | POA: Insufficient documentation

## 2011-03-24 ENCOUNTER — Telehealth: Payer: Self-pay | Admitting: Pharmacist

## 2011-04-01 ENCOUNTER — Telehealth: Payer: Self-pay | Admitting: Pharmacist

## 2011-04-03 ENCOUNTER — Ambulatory Visit: Payer: BC Managed Care – PPO

## 2011-04-06 ENCOUNTER — Other Ambulatory Visit: Payer: Self-pay | Admitting: Pharmacist

## 2011-04-06 ENCOUNTER — Ambulatory Visit (HOSPITAL_BASED_OUTPATIENT_CLINIC_OR_DEPARTMENT_OTHER): Payer: BC Managed Care – PPO

## 2011-04-06 ENCOUNTER — Ambulatory Visit: Payer: Self-pay | Admitting: Oncology

## 2011-04-06 ENCOUNTER — Other Ambulatory Visit: Payer: Self-pay

## 2011-04-06 ENCOUNTER — Other Ambulatory Visit (HOSPITAL_BASED_OUTPATIENT_CLINIC_OR_DEPARTMENT_OTHER): Payer: BC Managed Care – PPO | Admitting: Lab

## 2011-04-06 DIAGNOSIS — Z23 Encounter for immunization: Secondary | ICD-10-CM

## 2011-04-06 DIAGNOSIS — Z7901 Long term (current) use of anticoagulants: Secondary | ICD-10-CM

## 2011-04-06 DIAGNOSIS — D6859 Other primary thrombophilia: Secondary | ICD-10-CM

## 2011-04-06 DIAGNOSIS — I82409 Acute embolism and thrombosis of unspecified deep veins of unspecified lower extremity: Secondary | ICD-10-CM

## 2011-04-06 LAB — PROTIME-INR: Protime: 26.4 Seconds — ABNORMAL HIGH (ref 10.6–13.4)

## 2011-04-06 MED ORDER — WARFARIN SODIUM 5 MG PO TABS: 5.0000 mg | ORAL_TABLET | Freq: Every day | ORAL | Status: DC

## 2011-04-06 MED ORDER — INFLUENZA VIRUS VACC SPLIT PF IM SUSP
0.5000 mL | Freq: Once | INTRAMUSCULAR | Status: AC
  Administered 2011-04-06: 0.5 mL via INTRAMUSCULAR
  Filled 2011-04-06: qty 0.5

## 2011-04-09 ENCOUNTER — Other Ambulatory Visit: Payer: BC Managed Care – PPO | Admitting: Lab

## 2011-04-09 ENCOUNTER — Ambulatory Visit: Payer: BC Managed Care – PPO

## 2011-04-23 ENCOUNTER — Other Ambulatory Visit: Payer: Self-pay

## 2011-04-23 NOTE — Telephone Encounter (Signed)
Received refill request via fax from [facility] on [address] for pt's warfarin.  Per system, this was sent electronically on 04/06/2011.  Called and spoke with pharmacist, who states he did receive this, and fax can be ignored.

## 2011-05-25 ENCOUNTER — Other Ambulatory Visit: Payer: Self-pay | Admitting: Pharmacist

## 2011-05-25 DIAGNOSIS — D6859 Other primary thrombophilia: Secondary | ICD-10-CM

## 2011-06-01 ENCOUNTER — Ambulatory Visit (HOSPITAL_BASED_OUTPATIENT_CLINIC_OR_DEPARTMENT_OTHER): Payer: BC Managed Care – PPO | Admitting: Lab

## 2011-06-01 ENCOUNTER — Ambulatory Visit: Payer: BC Managed Care – PPO | Admitting: Pharmacist

## 2011-06-01 DIAGNOSIS — I82409 Acute embolism and thrombosis of unspecified deep veins of unspecified lower extremity: Secondary | ICD-10-CM

## 2011-06-01 DIAGNOSIS — D6859 Other primary thrombophilia: Secondary | ICD-10-CM

## 2011-06-01 DIAGNOSIS — Z7901 Long term (current) use of anticoagulants: Secondary | ICD-10-CM

## 2011-06-01 LAB — POCT INR: INR: 1.6

## 2011-06-01 LAB — PROTIME-INR: INR: 1.6 — ABNORMAL LOW (ref 2.00–3.50)

## 2011-06-01 MED ORDER — WARFARIN SODIUM 5 MG PO TABS: ORAL_TABLET | ORAL | Status: DC

## 2011-06-01 NOTE — Progress Notes (Signed)
INR slightly below goal.  Likely due to the fact he has not been consistently taking 7.5 mg once weekly & his vit K dietary intake has increased recently per pt. Pt going to [place] on Thurs for 2 weeks. Cont. Same Coumadin dose. Recheck INR when he returns from his trip. Marily Lente, Pharm.D.

## 2011-06-04 ENCOUNTER — Other Ambulatory Visit: Payer: BC Managed Care – PPO | Admitting: Lab

## 2011-06-04 ENCOUNTER — Ambulatory Visit: Payer: BC Managed Care – PPO

## 2011-07-02 ENCOUNTER — Other Ambulatory Visit: Payer: BC Managed Care – PPO | Admitting: Lab

## 2011-07-02 ENCOUNTER — Ambulatory Visit (HOSPITAL_BASED_OUTPATIENT_CLINIC_OR_DEPARTMENT_OTHER): Payer: BC Managed Care – PPO | Admitting: Pharmacist

## 2011-07-02 DIAGNOSIS — D6859 Other primary thrombophilia: Secondary | ICD-10-CM

## 2011-07-02 DIAGNOSIS — Z7901 Long term (current) use of anticoagulants: Secondary | ICD-10-CM

## 2011-07-02 LAB — PROTIME-INR: INR: 1.8 — ABNORMAL LOW (ref 2.00–3.50)

## 2011-07-02 NOTE — Patient Instructions (Signed)
Pt dose not have any changes to report.  He says his diet may have had more greens, esp while he was in [place].  He has been back since March 9.  We will give him 7.5 mg today and continue with the 5mg  daily with a 7.5 mg on one day of the week.  He states is not consistent with the day of the week that is 7.5 but he does get it in at least once.

## 2011-08-20 ENCOUNTER — Other Ambulatory Visit: Payer: BC Managed Care – PPO | Admitting: Lab

## 2011-08-20 ENCOUNTER — Ambulatory Visit: Payer: BC Managed Care – PPO

## 2011-08-25 ENCOUNTER — Other Ambulatory Visit: Payer: BC Managed Care – PPO | Admitting: Lab

## 2011-08-25 ENCOUNTER — Ambulatory Visit: Payer: BC Managed Care – PPO

## 2011-08-25 ENCOUNTER — Ambulatory Visit: Payer: Self-pay | Admitting: Pharmacist

## 2011-08-25 ENCOUNTER — Ambulatory Visit (HOSPITAL_BASED_OUTPATIENT_CLINIC_OR_DEPARTMENT_OTHER): Payer: BC Managed Care – PPO | Admitting: Lab

## 2011-08-25 DIAGNOSIS — Z7901 Long term (current) use of anticoagulants: Secondary | ICD-10-CM

## 2011-08-25 DIAGNOSIS — D6859 Other primary thrombophilia: Secondary | ICD-10-CM

## 2011-08-25 LAB — PROTIME-INR

## 2011-08-25 NOTE — Progress Notes (Signed)
INR = 3.3  Pt continues on 5 mg/day; 7.5 mg once/week He is asymptomatic No change to dose.  Return to clinic ~July.  Pt will call for appt in July.  I have not scheduled anything specific. Marily Lente, Pharm.D.

## 2011-09-23 ENCOUNTER — Telehealth: Payer: Self-pay | Admitting: Medical Oncology

## 2011-09-23 DIAGNOSIS — Z7901 Long term (current) use of anticoagulants: Secondary | ICD-10-CM

## 2011-09-23 DIAGNOSIS — D6859 Other primary thrombophilia: Secondary | ICD-10-CM

## 2011-09-23 DIAGNOSIS — I82409 Acute embolism and thrombosis of unspecified deep veins of unspecified lower extremity: Secondary | ICD-10-CM

## 2011-09-23 MED ORDER — WARFARIN SODIUM 5 MG PO TABS: ORAL_TABLET | ORAL | Status: DC

## 2011-09-23 NOTE — Telephone Encounter (Signed)
Sent to Dr Cyndie Chime for escribe approval

## 2011-11-13 ENCOUNTER — Telehealth: Payer: Self-pay | Admitting: Pharmacist

## 2011-11-13 NOTE — Telephone Encounter (Signed)
Spoke with patient about coumadin appointment. Patient stated "yes, I need to make an appointment". He will check his schedule and contact us to schedule a day and time

## 2011-11-19 ENCOUNTER — Ambulatory Visit (HOSPITAL_BASED_OUTPATIENT_CLINIC_OR_DEPARTMENT_OTHER): Payer: BC Managed Care – PPO

## 2011-11-19 ENCOUNTER — Ambulatory Visit (HOSPITAL_BASED_OUTPATIENT_CLINIC_OR_DEPARTMENT_OTHER): Payer: BC Managed Care – PPO | Admitting: Pharmacist

## 2011-11-19 ENCOUNTER — Other Ambulatory Visit: Payer: BC Managed Care – PPO | Admitting: Lab

## 2011-11-19 DIAGNOSIS — D6859 Other primary thrombophilia: Secondary | ICD-10-CM

## 2011-11-19 DIAGNOSIS — Z7901 Long term (current) use of anticoagulants: Secondary | ICD-10-CM

## 2011-11-19 NOTE — Progress Notes (Signed)
INR slightly subtherapeutic today (1.9). Slightly increased Vitamin K intake today, and may have missed a dose of 7.5mg  last week and taken a 5mg  instead.  No other changes in medications.  No problems with bleeding or bruising.  Will have pt continue taking 5mg  daily except 7.5mg  on Thursdays.   Recheck INR in 2 months.

## 2012-01-04 ENCOUNTER — Other Ambulatory Visit: Payer: Self-pay | Admitting: *Deleted

## 2012-01-04 DIAGNOSIS — D6859 Other primary thrombophilia: Secondary | ICD-10-CM

## 2012-01-04 DIAGNOSIS — Z7901 Long term (current) use of anticoagulants: Secondary | ICD-10-CM

## 2012-01-04 DIAGNOSIS — I82409 Acute embolism and thrombosis of unspecified deep veins of unspecified lower extremity: Secondary | ICD-10-CM

## 2012-01-04 MED ORDER — WARFARIN SODIUM 5 MG PO TABS: ORAL_TABLET | ORAL | Status: DC

## 2012-01-18 ENCOUNTER — Other Ambulatory Visit: Payer: BC Managed Care – PPO

## 2012-01-18 ENCOUNTER — Ambulatory Visit: Payer: BC Managed Care – PPO

## 2012-01-27 ENCOUNTER — Ambulatory Visit: Payer: BC Managed Care – PPO | Admitting: Lab

## 2012-01-27 ENCOUNTER — Ambulatory Visit (HOSPITAL_BASED_OUTPATIENT_CLINIC_OR_DEPARTMENT_OTHER): Payer: BC Managed Care – PPO | Admitting: Pharmacist

## 2012-01-27 DIAGNOSIS — D6859 Other primary thrombophilia: Secondary | ICD-10-CM

## 2012-01-27 DIAGNOSIS — Z7901 Long term (current) use of anticoagulants: Secondary | ICD-10-CM

## 2012-01-27 LAB — POCT INR: INR: 2.1

## 2012-01-27 LAB — PROTIME-INR: INR: 2.1 (ref 2.00–3.50)

## 2012-01-27 NOTE — Progress Notes (Signed)
Continue with 5mg  daily and 7.5 mg on one day of the week.   Return to the clinic 03/30/12 at 1pm for lab, 1:15pm for Coumadin Clinic. Pt will be leaving for international travels on 04/03/12.

## 2012-01-27 NOTE — Patient Instructions (Addendum)
Continue with 5mg  daily and 7.5 mg on one day of the week.   Return to the clinic 03/30/12 at 1pm for lab, 1:15pm for Coumadin Clinic. Monitor the area on your left leg that is twitching and let us know if it becomes swollen, red, warm, painful, bothersome or more tender.

## 2012-03-30 ENCOUNTER — Other Ambulatory Visit (HOSPITAL_BASED_OUTPATIENT_CLINIC_OR_DEPARTMENT_OTHER): Payer: BC Managed Care – PPO | Admitting: Lab

## 2012-03-30 ENCOUNTER — Ambulatory Visit (HOSPITAL_BASED_OUTPATIENT_CLINIC_OR_DEPARTMENT_OTHER): Payer: BC Managed Care – PPO | Admitting: Pharmacist

## 2012-03-30 DIAGNOSIS — Z7901 Long term (current) use of anticoagulants: Secondary | ICD-10-CM

## 2012-03-30 DIAGNOSIS — D6859 Other primary thrombophilia: Secondary | ICD-10-CM

## 2012-03-30 DIAGNOSIS — Z5181 Encounter for therapeutic drug level monitoring: Secondary | ICD-10-CM

## 2012-03-30 LAB — PROTIME-INR: INR: 2.2 (ref 2.00–3.50)

## 2012-03-30 NOTE — Progress Notes (Signed)
INR therapeutic today (2.2) on 5mg  daily except 7.5mg  1x/week. Pt leaving for international travel on 04/03/12.  Will be picking up an early Rx from his [facility] pharmacy. No changes in diet.  Finished a course of azithromycin about 2 weeks ago.  No other significant changes or complaints.  Continue current dose, recheck INR in 2 months.

## 2012-05-25 ENCOUNTER — Ambulatory Visit: Payer: BC Managed Care – PPO | Admitting: Pharmacist

## 2012-05-25 ENCOUNTER — Ambulatory Visit (HOSPITAL_BASED_OUTPATIENT_CLINIC_OR_DEPARTMENT_OTHER): Payer: BC Managed Care – PPO | Admitting: Pharmacist

## 2012-05-25 ENCOUNTER — Other Ambulatory Visit: Payer: Self-pay | Admitting: Nurse Practitioner

## 2012-05-25 ENCOUNTER — Other Ambulatory Visit (HOSPITAL_BASED_OUTPATIENT_CLINIC_OR_DEPARTMENT_OTHER): Payer: BC Managed Care – PPO

## 2012-05-25 DIAGNOSIS — Z7901 Long term (current) use of anticoagulants: Secondary | ICD-10-CM

## 2012-05-25 DIAGNOSIS — D6859 Other primary thrombophilia: Secondary | ICD-10-CM

## 2012-05-25 NOTE — Progress Notes (Signed)
INR = 2.4 on 5 mg/day; 7.5 mg once week Pt left after lab & did not stay for Coumadin clinic appt. INR therapeutic.  No change to dose. Repeat protime in 2 mos. Marily Lente, Pharm.D.

## 2012-06-21 ENCOUNTER — Encounter: Payer: Self-pay | Admitting: Pharmacist

## 2012-06-21 NOTE — Progress Notes (Signed)
Pt called pharmacy to alert [place] that he has a recurrent wound below L ankle.  He states this returns every 3-4 years since initial presentation in 2007. He is avoiding swimming but is active in other activities/exercises. He has not contacted his PCP, rather he is seeing Derm, Dr. Terri Piedra this Thursday.  He was referred to Dr. Terri Piedra about this problem by his PCP in the past. Pt reports the wound has been itching & a little swollen. No calf tenderness, redness. Note pts INR on 05/25/12 was therapeutic = 2.4 taking 5mg  daily and 7.5 mg on one day of the week. No need for pt to come here to see [place] about this.  He will call if Dr. Terri Piedra makes med changes (abx/steroids) so that we can check INR if necessary. Ebony Hail, Pharm.D., CPP 06/21/2012@12 :07 PM

## 2012-07-20 ENCOUNTER — Ambulatory Visit (HOSPITAL_BASED_OUTPATIENT_CLINIC_OR_DEPARTMENT_OTHER): Payer: BC Managed Care – PPO | Admitting: Pharmacist

## 2012-07-20 ENCOUNTER — Other Ambulatory Visit (HOSPITAL_BASED_OUTPATIENT_CLINIC_OR_DEPARTMENT_OTHER): Payer: BC Managed Care – PPO | Admitting: Lab

## 2012-07-20 VITALS — BP 121/84 | HR 67

## 2012-07-20 DIAGNOSIS — Z7901 Long term (current) use of anticoagulants: Secondary | ICD-10-CM

## 2012-07-20 DIAGNOSIS — D6859 Other primary thrombophilia: Secondary | ICD-10-CM

## 2012-07-20 LAB — POCT INR: INR: 1.8

## 2012-07-20 LAB — PROTIME-INR: INR: 1.8 — ABNORMAL LOW (ref 2.00–3.50)

## 2012-07-20 NOTE — Progress Notes (Signed)
INR = 1.8 on warfarin 5 mg/day; 7.5 mg once weekly Pt has stasis ulcer L interior ankle area.  He is seeing Dr Terri Piedra (derm) for this.  He is using an assortment of steroid creams.  He uses polysporin if there are tender areas on his foot/ankle. Because of the stasis ulcer, he is avoiding his swimming exercises since the end of Jan/first of Feb.  He'd like to start back on this.  I have advised him to use waterproof bandages & I gave him Tegaderm to try on the foot so that he can keep it dry for swimming. He is eating almonds every morning & he states this has helped his acid reflux.  He rarely uses Pepcid AC. INR is a little low today but he has not had his 7.5 mg dose yet.  He will take that today.  No change to Coumadin dose. If pt requires PO abx for ulcer on his foot, he knows to call us so that we can check his INR for interaction.  Otherwise we can see him in 8 weeks. Ebony Hail, Pharm.D., CPP 07/20/2012@3 :02 PM

## 2012-08-16 ENCOUNTER — Other Ambulatory Visit: Payer: Self-pay | Admitting: Pharmacist

## 2012-08-16 DIAGNOSIS — Z7901 Long term (current) use of anticoagulants: Secondary | ICD-10-CM

## 2012-08-16 DIAGNOSIS — D6859 Other primary thrombophilia: Secondary | ICD-10-CM

## 2012-08-16 MED ORDER — WARFARIN SODIUM 5 MG PO TABS: ORAL_TABLET | ORAL | Status: DC

## 2012-08-16 NOTE — Telephone Encounter (Signed)
Prescription refill for warfarin 5mg  tablets called to the [facility] on [address] Ph: [phone] Take po daily as directed. #35 tablets Dr. Cephas Darby Refills = 4

## 2012-08-16 NOTE — Progress Notes (Signed)
Prescription refill for warfarin 5mg tablets called to the [facility] on [address] Ph: [phone] Take po daily as directed. #35 tablets Dr. James Granfortuna Refills = 4 

## 2012-09-14 ENCOUNTER — Other Ambulatory Visit (HOSPITAL_BASED_OUTPATIENT_CLINIC_OR_DEPARTMENT_OTHER): Payer: BC Managed Care – PPO | Admitting: Lab

## 2012-09-14 ENCOUNTER — Ambulatory Visit (HOSPITAL_BASED_OUTPATIENT_CLINIC_OR_DEPARTMENT_OTHER): Payer: BC Managed Care – PPO | Admitting: Pharmacist

## 2012-09-14 DIAGNOSIS — D6859 Other primary thrombophilia: Secondary | ICD-10-CM

## 2012-09-14 DIAGNOSIS — Z7901 Long term (current) use of anticoagulants: Secondary | ICD-10-CM

## 2012-09-14 LAB — PROTIME-INR: INR: 2.5 (ref 2.00–3.50)

## 2012-09-14 NOTE — Progress Notes (Signed)
INR within goal today. No problems to report. Pt doing well. 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 Will continue with 5mg  daily except 7.5 mg on one day of the week.   Return to the clinic 11/09/12 at 3pm for lab, 3:15pm for Coumadin Clinic.

## 2012-09-14 NOTE — Patient Instructions (Addendum)
Continue with 5mg  daily except 7.5 mg on one day of the week.   Return to the clinic 11/09/12 at 3pm for lab, 3:15pm for Coumadin Clinic.

## 2012-11-09 ENCOUNTER — Other Ambulatory Visit: Payer: BC Managed Care – PPO | Admitting: Lab

## 2012-11-09 ENCOUNTER — Ambulatory Visit: Payer: BC Managed Care – PPO

## 2012-12-21 ENCOUNTER — Ambulatory Visit (HOSPITAL_BASED_OUTPATIENT_CLINIC_OR_DEPARTMENT_OTHER): Payer: Self-pay | Admitting: Pharmacist

## 2012-12-21 ENCOUNTER — Other Ambulatory Visit (HOSPITAL_BASED_OUTPATIENT_CLINIC_OR_DEPARTMENT_OTHER): Payer: BC Managed Care – PPO | Admitting: Lab

## 2012-12-21 VITALS — BP 110/74 | HR 61 | Wt 153.7 lb

## 2012-12-21 DIAGNOSIS — D6859 Other primary thrombophilia: Secondary | ICD-10-CM

## 2012-12-21 DIAGNOSIS — Z7901 Long term (current) use of anticoagulants: Secondary | ICD-10-CM

## 2012-12-21 LAB — PROTIME-INR: INR: 2 (ref 2.00–3.50)

## 2012-12-21 NOTE — Progress Notes (Signed)
INR = 2 on 5 mg daily except 7.5 mg once weekly Wt = 153.7 lbs. BP = 110/74 mmHg HR = 61 bpm Pt c/o L ankle venous stasis ulcer discoloration.  This is a chronic issue for him.  He sees dermatologist for management but has not had an appt recently. He is avoiding the swimming pool for exercise due to the ulcer on his ankle. He has done some prolonged travel to [place].  He is going to [place] in December.  When he travels, he uses his compression stocking & he ambulates often. INR is at goal.  He'll continue same dosing strategy. Return in 2 months. It is noteworthy that he states his son Feliberto Harts has been having his INR checked while he is in grad school.  He is doing well. Ebony Hail, Pharm.D., CPP 12/21/2012@2 :14 PM

## 2013-02-15 ENCOUNTER — Telehealth: Payer: Self-pay | Admitting: Pharmacist

## 2013-02-15 ENCOUNTER — Other Ambulatory Visit: Payer: BC Managed Care – PPO | Admitting: Lab

## 2013-02-15 ENCOUNTER — Ambulatory Visit: Payer: BC Managed Care – PPO

## 2013-02-22 ENCOUNTER — Other Ambulatory Visit (HOSPITAL_BASED_OUTPATIENT_CLINIC_OR_DEPARTMENT_OTHER): Payer: BC Managed Care – PPO | Admitting: Lab

## 2013-02-22 ENCOUNTER — Ambulatory Visit (HOSPITAL_BASED_OUTPATIENT_CLINIC_OR_DEPARTMENT_OTHER): Payer: BC Managed Care – PPO | Admitting: Pharmacist

## 2013-02-22 DIAGNOSIS — D6859 Other primary thrombophilia: Secondary | ICD-10-CM

## 2013-02-22 DIAGNOSIS — Z7901 Long term (current) use of anticoagulants: Secondary | ICD-10-CM

## 2013-02-22 LAB — PROTIME-INR: INR: 2 (ref 2.00–3.50)

## 2013-02-22 LAB — POCT INR: INR: 2

## 2013-02-22 NOTE — Progress Notes (Signed)
INR = 2    Goal 2-3 Patient takes Coumadin 5 mg daily except 7.5 mg Thursday.  Patient states he often skips 7.5 mg dose and just takes 5 mg daily. No complications of anticoagulation noted. Patient is hoping to travel to [place] and [place] in December if he can get his Carla Drape in order, travel to [place] is difficult.  He hopes to visit his sister and her son in [place].  His nephew is 59 YO and is being treated for nonseminomatous mediastinal cancer in [place]. Dr. Audree Camel is quite concerned about this.  Dr. Audree Camel will return to [facility] 04/18/13. He will continue his current dose of Coumadin and return to Coumadin Clinic in 2 months following his trip. He will be seen 04/20/13 at 1:30 in lab and 1:45 in CC. Cletis Athens, PharmD

## 2013-03-16 ENCOUNTER — Other Ambulatory Visit: Payer: Self-pay | Admitting: *Deleted

## 2013-03-16 DIAGNOSIS — D6859 Other primary thrombophilia: Secondary | ICD-10-CM

## 2013-03-16 DIAGNOSIS — Z7901 Long term (current) use of anticoagulants: Secondary | ICD-10-CM

## 2013-03-16 MED ORDER — WARFARIN SODIUM 5 MG PO TABS: ORAL_TABLET | ORAL | Status: DC

## 2013-04-20 ENCOUNTER — Ambulatory Visit (HOSPITAL_BASED_OUTPATIENT_CLINIC_OR_DEPARTMENT_OTHER): Payer: Self-pay | Admitting: Pharmacist

## 2013-04-20 ENCOUNTER — Other Ambulatory Visit (HOSPITAL_BASED_OUTPATIENT_CLINIC_OR_DEPARTMENT_OTHER): Payer: BC Managed Care – PPO

## 2013-04-20 DIAGNOSIS — Z7901 Long term (current) use of anticoagulants: Secondary | ICD-10-CM

## 2013-04-20 DIAGNOSIS — D6859 Other primary thrombophilia: Secondary | ICD-10-CM

## 2013-04-20 LAB — PROTIME-INR
INR: 1.7 — ABNORMAL LOW (ref 2.00–3.50)
PROTIME: 20.4 s — AB (ref 10.6–13.4)

## 2013-04-20 LAB — POCT INR: INR: 1.7

## 2013-04-20 NOTE — Patient Instructions (Addendum)
INR below goal but today you are scheduled to take a higher dose No changes Continue with 5mg  daily except 7.5 mg on one day of the week.   Return to the clinic 06/15/13 at 3 pm for lab, 3:15 pm for Coumadin Clinic.

## 2013-04-20 NOTE — Progress Notes (Signed)
INR below goal today (pt has been very stable on current dose) Pt is doing well He is seen in clinic today after he has returned from his trip to [place] He returned yesterday and is still tired from his travels/time changes He reports no issues His blood pressure was checked in the office today and it was 104/77 Pt reports no bruising or bleeding No diet or medication changes Pt states he may have missed 1 dose recently but is unsure Plan: Since patient has been stable on his current dose and he may have missed a dose will make no changes Pt is scheduled to take 7.5 mg today anyways and will do so tonight Continue with 5mg  daily except 7.5 mg on one day of the week.   Return to the clinic 06/15/13 at 3 pm for lab, 3:15 pm for Coumadin Clinic.

## 2013-06-12 ENCOUNTER — Other Ambulatory Visit: Payer: Self-pay | Admitting: *Deleted

## 2013-06-12 ENCOUNTER — Other Ambulatory Visit: Payer: Self-pay | Admitting: Pharmacist

## 2013-06-12 DIAGNOSIS — Z7901 Long term (current) use of anticoagulants: Secondary | ICD-10-CM

## 2013-06-12 DIAGNOSIS — D6859 Other primary thrombophilia: Secondary | ICD-10-CM

## 2013-06-12 MED ORDER — WARFARIN SODIUM 5 MG PO TABS: ORAL_TABLET | ORAL | Status: DC

## 2013-06-15 ENCOUNTER — Ambulatory Visit (HOSPITAL_BASED_OUTPATIENT_CLINIC_OR_DEPARTMENT_OTHER): Payer: BC Managed Care – PPO | Admitting: Pharmacist

## 2013-06-15 ENCOUNTER — Other Ambulatory Visit (HOSPITAL_BASED_OUTPATIENT_CLINIC_OR_DEPARTMENT_OTHER): Payer: BC Managed Care – PPO

## 2013-06-15 DIAGNOSIS — Z7901 Long term (current) use of anticoagulants: Secondary | ICD-10-CM

## 2013-06-15 DIAGNOSIS — D6859 Other primary thrombophilia: Secondary | ICD-10-CM

## 2013-06-15 LAB — PROTIME-INR
INR: 2.1 (ref 2.00–3.50)
PROTIME: 25.2 s — AB (ref 10.6–13.4)

## 2013-06-15 LAB — POCT INR: INR: 2.1

## 2013-06-15 NOTE — Progress Notes (Signed)
INR - 2.1 INR is within 2-3 goal range. No complications of anticoagulation noted. No medication changes. Will continue current dose of 5 mg daily except 7.5 mg one day each week. He usually comes to Coumadin Clinic every 8 weeks, but would like to wait another week and come after final exams if possible. He will return on 08/24/13 for lab at 2:00, Coumadin Clinic at 2:15. [facility] [address], will do this today.  Cletis AthensLisa Adrian Dinovo, PharmD

## 2013-08-23 ENCOUNTER — Telehealth: Payer: Self-pay | Admitting: Pharmacist

## 2013-08-23 NOTE — Telephone Encounter (Signed)
Patient called and left a message that he had his INR drawn 4/29 at his PCP's office and it was 2.9.   He is not planning to come to his 5/14 appt.   Left VM asking him to call and schedule his next appt.

## 2013-08-24 ENCOUNTER — Ambulatory Visit: Payer: BC Managed Care – PPO

## 2013-08-24 ENCOUNTER — Other Ambulatory Visit: Payer: BC Managed Care – PPO

## 2013-10-05 ENCOUNTER — Ambulatory Visit: Payer: BC Managed Care – PPO

## 2013-10-05 ENCOUNTER — Telehealth: Payer: Self-pay | Admitting: Pharmacist

## 2013-10-05 ENCOUNTER — Other Ambulatory Visit: Payer: BC Managed Care – PPO

## 2013-10-05 NOTE — Telephone Encounter (Signed)
Patient missed lab and coumadin clinic visit this afternoon. I called and left VM for patient to call back and reschedule these appointments  Thank you,  [name], PharmD 

## 2013-10-11 ENCOUNTER — Other Ambulatory Visit (HOSPITAL_BASED_OUTPATIENT_CLINIC_OR_DEPARTMENT_OTHER): Payer: BC Managed Care – PPO

## 2013-10-11 ENCOUNTER — Ambulatory Visit (HOSPITAL_BASED_OUTPATIENT_CLINIC_OR_DEPARTMENT_OTHER): Payer: BC Managed Care – PPO | Admitting: Pharmacist

## 2013-10-11 DIAGNOSIS — D6859 Other primary thrombophilia: Secondary | ICD-10-CM

## 2013-10-11 DIAGNOSIS — Z7901 Long term (current) use of anticoagulants: Secondary | ICD-10-CM

## 2013-10-11 LAB — POCT INR: INR: 2.3

## 2013-10-11 LAB — PROTIME-INR
INR: 2.3 (ref 2.00–3.50)
Protime: 27.6 Seconds — ABNORMAL HIGH (ref 10.6–13.4)

## 2013-10-11 NOTE — Progress Notes (Signed)
INR contiues to be at goal.  No med changes.  No health changes.  No bleeding or bruising.  Mr Albert Wolfe has been taking coumadin 5mg  daily and 7.5mg  usually about 1 time per week.  He adjusts this dose himself.  Will continue with coumadin 5mg  dialy and 7.5mg  1 time per week.  Will check PT/INR in 2 months.

## 2013-11-20 NOTE — Telephone Encounter (Signed)
Phone call - encounter closed. 

## 2013-11-29 ENCOUNTER — Other Ambulatory Visit: Payer: Self-pay | Admitting: Pharmacist

## 2013-11-29 DIAGNOSIS — Z7901 Long term (current) use of anticoagulants: Secondary | ICD-10-CM

## 2013-11-29 DIAGNOSIS — D6859 Other primary thrombophilia: Secondary | ICD-10-CM

## 2013-11-29 MED ORDER — WARFARIN SODIUM 5 MG PO TABS: ORAL_TABLET | ORAL | Status: DC

## 2013-12-07 NOTE — Telephone Encounter (Signed)
Encounter was telephone call. 

## 2013-12-22 ENCOUNTER — Ambulatory Visit: Payer: BC Managed Care – PPO

## 2013-12-22 ENCOUNTER — Other Ambulatory Visit: Payer: BC Managed Care – PPO

## 2013-12-27 ENCOUNTER — Ambulatory Visit (HOSPITAL_BASED_OUTPATIENT_CLINIC_OR_DEPARTMENT_OTHER): Payer: BC Managed Care – PPO | Admitting: Pharmacist

## 2013-12-27 ENCOUNTER — Other Ambulatory Visit (HOSPITAL_BASED_OUTPATIENT_CLINIC_OR_DEPARTMENT_OTHER): Payer: BC Managed Care – PPO

## 2013-12-27 DIAGNOSIS — D6859 Other primary thrombophilia: Secondary | ICD-10-CM

## 2013-12-27 DIAGNOSIS — Z7901 Long term (current) use of anticoagulants: Secondary | ICD-10-CM

## 2013-12-27 LAB — PROTIME-INR
INR: 2.3 (ref 2.00–3.50)
PROTIME: 27.6 s — AB (ref 10.6–13.4)

## 2013-12-27 LAB — POCT INR: INR: 2.3

## 2013-12-27 NOTE — Progress Notes (Signed)
*  No charge - Telephone encounter* INR at goal Pt is doing well with no complaints No unusual bleeding or bruising No missed or extra doses No medication or diet changes Plan: No changes Continue with  daily except 7.5 mg on one day of the week.   Check PT/INR around 02/21/14.  Call to set up appointment with you know your schedule [phone] * Dr. Cyndie Chime patient*

## 2013-12-27 NOTE — Patient Instructions (Signed)
INR at goal No changes Continue with  daily except 7.5 mg on one day of the week.  Check PT/INR around 02/21/14. Call to set up appointment with you know your schedule [phone]

## 2014-02-21 ENCOUNTER — Telehealth: Payer: Self-pay | Admitting: Pharmacist

## 2014-02-21 NOTE — Telephone Encounter (Signed)
I s/w pt over phone today.  He is due for INR check. He requested 03/07/14 at 11 am for lab only.  He does not need to come to see a Coumadin clinic pharmacist that day.  We can call him to discuss results when available. Pt needs to see Dr. Cyndie ChimeGranfortuna.  He has not seen him since sometime in 2011.  If we will continue to see him in our clinic, he needs to see Dr. Cyndie ChimeGranfortuna.  I have not discussed this w/ pt.  Please inform him on 03/07/14. Ebony HailGinna Latoya Diskin, Pharm.D., CPP 02/21/2014@4 :54 PM

## 2014-03-07 ENCOUNTER — Ambulatory Visit (INDEPENDENT_AMBULATORY_CARE_PROVIDER_SITE_OTHER): Payer: BC Managed Care – PPO | Admitting: Pharmacist

## 2014-03-07 ENCOUNTER — Other Ambulatory Visit (HOSPITAL_BASED_OUTPATIENT_CLINIC_OR_DEPARTMENT_OTHER): Payer: BC Managed Care – PPO

## 2014-03-07 DIAGNOSIS — Z7901 Long term (current) use of anticoagulants: Secondary | ICD-10-CM

## 2014-03-07 DIAGNOSIS — D6859 Other primary thrombophilia: Secondary | ICD-10-CM

## 2014-03-07 DIAGNOSIS — D6852 Prothrombin gene mutation: Secondary | ICD-10-CM

## 2014-03-07 LAB — POCT INR: INR: 1.9

## 2014-03-07 LAB — PROTIME-INR
INR: 1.9 — ABNORMAL LOW (ref 2.00–3.50)
PROTIME: 22.8 s — AB (ref 10.6–13.4)

## 2014-03-07 NOTE — Patient Instructions (Signed)
Continue with 5mg  daily except 7.5mg  on one day of the week.   Recheck PT/INR on 04/23/14; 11am for lab and 11:15am for coumadin clinic.

## 2014-03-07 NOTE — Progress Notes (Signed)
INR slightly below goal today. No problems or concerns regarding anticoagulation. No unusual bruising. No bleeding noted. No s/s of clotting noted. Pt has not been taking the 7.5mg  once/week consistently. He has been doing 5mg  daily. He also has started a probiotic and Pepcid AC about one week ago for stomach issues. His stomach gets uneasy, slight cramping -which is relieved with BM. He has increased amounts of stress lately at work and his nephew has testicular cancer and is undergoing treatment. He will be traveling to [place] 03/23/14-04/20/14. Continue with 5mg  daily except 7.5mg  on one day of the week.   Recheck PT/INR on 04/23/14; 11am for lab and 11:15am for coumadin clinic. Pt is due for an office visit with Dr. Cyndie ChimeGranfortuna. Spoke with patient by telephone. No charge encounter.

## 2014-03-22 ENCOUNTER — Ambulatory Visit: Payer: BC Managed Care – PPO | Admitting: Pharmacist

## 2014-03-22 ENCOUNTER — Other Ambulatory Visit (HOSPITAL_BASED_OUTPATIENT_CLINIC_OR_DEPARTMENT_OTHER): Payer: BC Managed Care – PPO

## 2014-03-22 ENCOUNTER — Ambulatory Visit: Payer: Self-pay | Admitting: Pharmacist

## 2014-03-22 DIAGNOSIS — Z7901 Long term (current) use of anticoagulants: Secondary | ICD-10-CM

## 2014-03-22 DIAGNOSIS — D6859 Other primary thrombophilia: Secondary | ICD-10-CM

## 2014-03-22 DIAGNOSIS — D6852 Prothrombin gene mutation: Secondary | ICD-10-CM

## 2014-03-22 LAB — PROTIME-INR
INR: 2.1 (ref 2.00–3.50)
PROTIME: 25.2 s — AB (ref 10.6–13.4)

## 2014-03-22 LAB — POCT INR: INR: 2.1

## 2014-03-22 NOTE — Progress Notes (Signed)
NO CHARGE - TELEPHONE ENCOUNTER ONLY  INR = 2.1   Goal 2-3 Dr. Audree CamelKuila came in for INR check prior to leaving for overseas trip tomorrow. He was concerned because his last INR was subtherapeutic. He has had no complications of anticoagulation. He will continue with 5mg  daily except 7.5mg  on one day of the week. We will recheck PT/INR around 04/23/14; 11am for lab and 11:15am for Coumadin clinic. He needs to make an appointment to see Dr. Cyndie ChimeGranfortuna. It has been at least 3 years since his last visit.  He can call Gentry Fitzoris Solomon at [phone] to schedule this.  Cletis AthensLisa Jude Linck, PharmD

## 2014-03-23 ENCOUNTER — Other Ambulatory Visit: Payer: BC Managed Care – PPO

## 2014-03-23 ENCOUNTER — Ambulatory Visit: Payer: BC Managed Care – PPO

## 2014-04-23 ENCOUNTER — Ambulatory Visit (HOSPITAL_BASED_OUTPATIENT_CLINIC_OR_DEPARTMENT_OTHER): Payer: BC Managed Care – PPO | Admitting: Pharmacist

## 2014-04-23 ENCOUNTER — Other Ambulatory Visit (HOSPITAL_BASED_OUTPATIENT_CLINIC_OR_DEPARTMENT_OTHER): Payer: BC Managed Care – PPO

## 2014-04-23 DIAGNOSIS — D6859 Other primary thrombophilia: Secondary | ICD-10-CM

## 2014-04-23 DIAGNOSIS — D6852 Prothrombin gene mutation: Secondary | ICD-10-CM

## 2014-04-23 DIAGNOSIS — Z7901 Long term (current) use of anticoagulants: Secondary | ICD-10-CM

## 2014-04-23 LAB — POCT INR: INR: 1.6

## 2014-04-23 LAB — PROTIME-INR
INR: 1.6 — AB (ref 2.00–3.50)
Protime: 19.2 Seconds — ABNORMAL HIGH (ref 10.6–13.4)

## 2014-04-23 NOTE — Progress Notes (Signed)
INR = 1.6    Goal 2-3 INR below goal range. Patient states he is just back from visiting family in [place] and ate very differently than usual while there. He feels that is why is INR is down. He is now returning to his regular diet. He may have missed a single dose of Coumadin due to the time change while flying. Since INR is low, I would like to see him sooner than usual. We will boost Coumadin today.  He will take Coumadin 7.5 mg today, then continue with 5mg  daily except 7.5mg  on one day of the week. We will recheck PT/INR 05/07/14; 1:30pm for lab and 1:45pm for Coumadin clinic.

## 2014-05-07 ENCOUNTER — Other Ambulatory Visit: Payer: BC Managed Care – PPO

## 2014-05-07 ENCOUNTER — Ambulatory Visit (HOSPITAL_BASED_OUTPATIENT_CLINIC_OR_DEPARTMENT_OTHER): Payer: BC Managed Care – PPO | Admitting: Pharmacist

## 2014-05-07 DIAGNOSIS — D6859 Other primary thrombophilia: Secondary | ICD-10-CM

## 2014-05-07 DIAGNOSIS — Z7901 Long term (current) use of anticoagulants: Secondary | ICD-10-CM

## 2014-05-07 LAB — POCT INR: INR: 2.4

## 2014-05-07 LAB — PROTIME-INR
INR: 2.4 (ref 2.00–3.50)
Protime: 28.8 Seconds — ABNORMAL HIGH (ref 10.6–13.4)

## 2014-05-07 NOTE — Progress Notes (Signed)
INR is within goal today. Pt is taking coumadin as instructed. No missed or extra coumadin doses. Pt has resumed his usual diet. He may be eating more pumpkin, flaxseed and squash. No changes in medications. He is taking Magnesium 140mg  daily because he feels the higher doses give him more diarrhea. No unusual bruising. No bleeding. No s/s of clotting noted. Continue with 5mg  daily except 7.5mg  on one day of the week.   Recheck PT/INR on 06/13/14; 1:30pm for lab and 1:45pm for Coumadin clinic. Reminded patient that he needs to set up an appt to see Dr. Cyndie ChimeGranfortuna.  Please call [phone].

## 2014-05-07 NOTE — Patient Instructions (Signed)
Continue with 5mg  daily except 7.5mg  on one day of the week.   Recheck PT/INR on 06/13/14; 1:30pm for lab and 1:45pm for Coumadin clinic.

## 2014-06-04 ENCOUNTER — Telehealth: Payer: Self-pay | Admitting: Oncology

## 2014-06-04 NOTE — Telephone Encounter (Signed)
Call to patient to confirm appointment for 06/05/14 at 2:30. lmtcb

## 2014-06-05 ENCOUNTER — Ambulatory Visit (INDEPENDENT_AMBULATORY_CARE_PROVIDER_SITE_OTHER): Payer: BC Managed Care – PPO | Admitting: Oncology

## 2014-06-05 ENCOUNTER — Encounter: Payer: Self-pay | Admitting: Oncology

## 2014-06-05 VITALS — BP 128/78 | HR 64 | Temp 97.8°F | Ht 67.0 in | Wt 158.2 lb

## 2014-06-05 DIAGNOSIS — I82502 Chronic embolism and thrombosis of unspecified deep veins of left lower extremity: Secondary | ICD-10-CM | POA: Diagnosis not present

## 2014-06-05 DIAGNOSIS — Z7901 Long term (current) use of anticoagulants: Secondary | ICD-10-CM | POA: Diagnosis not present

## 2014-06-05 DIAGNOSIS — D6859 Other primary thrombophilia: Secondary | ICD-10-CM

## 2014-06-05 LAB — CBC WITH DIFFERENTIAL/PLATELET
BASOS PCT: 1 % (ref 0–1)
Basophils Absolute: 0.1 10*3/uL (ref 0.0–0.1)
EOS ABS: 0.1 10*3/uL (ref 0.0–0.7)
EOS PCT: 1 % (ref 0–5)
HCT: 44.1 % (ref 39.0–52.0)
Hemoglobin: 14.8 g/dL (ref 13.0–17.0)
LYMPHS ABS: 2.1 10*3/uL (ref 0.7–4.0)
Lymphocytes Relative: 34 % (ref 12–46)
MCH: 29.9 pg (ref 26.0–34.0)
MCHC: 33.6 g/dL (ref 30.0–36.0)
MCV: 89.1 fL (ref 78.0–100.0)
MONO ABS: 0.4 10*3/uL (ref 0.1–1.0)
MPV: 9.9 fL (ref 8.6–12.4)
Monocytes Relative: 6 % (ref 3–12)
Neutro Abs: 3.7 10*3/uL (ref 1.7–7.7)
Neutrophils Relative %: 58 % (ref 43–77)
Platelets: 182 10*3/uL (ref 150–400)
RBC: 4.95 MIL/uL (ref 4.22–5.81)
RDW: 13.3 % (ref 11.5–15.5)
WBC: 6.3 10*3/uL (ref 4.0–10.5)

## 2014-06-05 LAB — COMPREHENSIVE METABOLIC PANEL
ALT: 24 U/L (ref 0–53)
AST: 24 U/L (ref 0–37)
Albumin: 4 g/dL (ref 3.5–5.2)
Alkaline Phosphatase: 82 U/L (ref 39–117)
BILIRUBIN TOTAL: 0.4 mg/dL (ref 0.2–1.2)
BUN: 16 mg/dL (ref 6–23)
CO2: 28 mEq/L (ref 19–32)
CREATININE: 0.78 mg/dL (ref 0.50–1.35)
Calcium: 9.1 mg/dL (ref 8.4–10.5)
Chloride: 100 mEq/L (ref 96–112)
Glucose, Bld: 73 mg/dL (ref 70–99)
Potassium: 4 mEq/L (ref 3.5–5.3)
SODIUM: 136 meq/L (ref 135–145)
TOTAL PROTEIN: 6.6 g/dL (ref 6.0–8.3)

## 2014-06-05 LAB — PROTIME-INR
INR: 2.46 — ABNORMAL HIGH (ref 0.00–1.49)
PROTHROMBIN TIME: 26.9 s — AB (ref 11.6–15.2)

## 2014-06-05 NOTE — Progress Notes (Signed)
Patient ID: Albert Wolfe, male   DOB: 04/09/1956, 59 y.o.   MRN: [id number] Hematology and Oncology Follow Up Visit  Albert Wolfe [id number] 04-24-1955 59 y.o. 06/05/2014 4:04 PM   Principle Diagnosis: Encounter Diagnoses  Name Primary?  . Chronic anticoagulation Yes  . Protein S deficiency   . 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 He moved to [facility] in September 2006 and presented with recurrent DVTs. Further evaluation showed that he has protein S deficiency. He has been on chronic Coumadin anticoagulation since that time. He has not kept a scheduled appointment with me in over 5 years and was last examined by me in December 2011. He has followed up on a more regular basis at the [facility] Coumadin clinic and I have continued to supervise his Coumadin dosing. I communicated to him through our pharmacists  that I would no longer be able to manage his Coumadin unless he scheduled a physician visit.  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 He wears elastic stockings on a regular basis.  One of his sons developed a DVT at age 90 and is currently on warfarin. His other son is also affected and takes aspirin prophylactically but has not had any blood clots yet.   Interim History:   He is doing well at this time. He denies any dyspnea, chest pain, palpitations. He has not developed any recurrent venous stasis ulcers. He takes meticulous care of his legs. He reports one episode when he was traveling to [place] when he got a cold and took a herbal remedy. He passed out at the airport. He states he was only out for a few minutes. Medical evaluation was on revealing and he was able to fly home without any further interventions. He has had no recurrent  episodes. He tells me that his son was traveling in [place] last summer and decided he would stop his Coumadin and just use aspirin and he had another DVT.   Medications:   Current outpatient prescriptions:  .  bacitracin-polymyxin b (POLYSPORIN) ointment, Apply topically as needed (for skin irritation)., Disp: , Rfl:  .  clobetasol cream (TEMOVATE) 0.05 %, Apply 1 application topically as needed (for skin ulceration/irritation)., Disp: , Rfl:  .  Famotidine (PEPCID AC PO), Take 10 mg by mouth as needed., Disp: , Rfl:  .  Magnesium Oxide 140 MG CAPS, Take 140 mg by mouth daily. For leg cramps, Disp: , Rfl:  .  Probiotic Product (ALIGN PO), Take by mouth., Disp: , Rfl:  .  Pyridoxine HCl (VITAMIN B-6) 25 MG tablet, Take 25 mg by mouth daily., Disp: , Rfl:  .  vitamin B-12 (CYANOCOBALAMIN) 250 MCG tablet, Take 250 mcg by mouth daily as needed., Disp: , Rfl:  .  warfarin (COUMADIN) 5 MG tablet, Take 5 mg daily except 7.5 mg on Mondays or as directed., Disp: 40 tablet, Rfl: 6  Allergies:  Allergies  Allergen Reactions  . Erythromycin Diarrhea  . Ibuprofen Diarrhea and Other (See Comments)    Ibuprofen causes gastric upset.    Review of Systems: See HPI. He still gets intermittent reflux symptoms and is now back on an H2 blocker-Pepcid AC Remaining ROS negative:   Physical Exam: Blood pressure 128/78, pulse 64, temperature 97.8 F (36.6 C), temperature source Oral, height  5\' 7"  (1.702 m), weight 158 lb 3.2 oz (71.759 kg), SpO2 100 %. Wt Readings from Last 3 Encounters:  06/05/14 158 lb 3.2 oz (71.759 kg)  12/21/12 153 lb 11.2 oz (69.718 kg)     General appearance: well nourished [place] man HENNT: Pharynx no erythema, exudate, mass, or ulcer. No thyromegaly or thyroid nodules Lymph nodes: No cervical, supraclavicular, or axillary lymphadenopathy Breasts:  Lungs: Clear to auscultation, resonant to percussion throughout Heart: Regular rhythm, no murmur, no gallop, no rub, no click, no  edema Abdomen: Soft, nontender, normal bowel sounds, no mass, no organomegaly Extremities: No edema, no calf tenderness Musculoskeletal: no joint deformities GU:  Vascular: Carotid pulses 2+, no bruits, Neurologic: Alert, oriented, PERRLA, optic discs sharp and vessels normal, no hemorrhage or exudate, cranial nerves grossly normal, motor strength 5 over 5, reflexes 1+ symmetric, upper body coordination normal, gait normal, Skin: No rash or ecchymosis  Lab Results: CBC W/Diff    Component Value Date/Time   WBC 6.6 03/14/2010 1125   RBC 5.25 03/14/2010 1125   HGB 16.0 03/14/2010 1125   HCT 45.0 03/14/2010 1125   PLT 167 03/14/2010 1125   MCV 85.7 03/14/2010 1125   MCH 30.5 03/14/2010 1125   MCHC 35.6 03/14/2010 1125   RDW 12.1 03/14/2010 1125   LYMPHSABS 2.0 03/14/2010 1125   MONOABS 0.3 03/14/2010 1125   EOSABS 0.2 03/14/2010 1125   BASOSABS 0.1 03/14/2010 1125     Chemistry   No results found for: NA, K, CL, CO2, BUN, CREATININE, GLU No results found for: CALCIUM, ALKPHOS, AST, ALT, BILITOT     Radiological Studies: No results found.  Impression:  #1. 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 Chronic venous stasis changes left lower extremity secondary to #2 #4. Chronic anticoagulation secondary to #1 and 2.  I updated him on the data with respect to target specific oral anticoagulants which have now been on the open market for about 5 years and in clinical trials for number of years before that. They have been safe and effective. We now have an antidote for Pradaxa and we will soon have an antidote for the Xa inhibitors which should extend the use of these agents even further. CHEST anticoagulation guidelines which are developed by a panel of international experts and updated every 4 or 5 years were recently updated in  December 2015. They are now promoting the use of the new target specific agents  as the drugs of choice in people who need to be on long-term anticoagulation due to their safety and efficacy. They are now preferred over warfarin. I discussed this with the patient and gave him printed information about 2 of the Xa inhibitors: Xarelto and Eliquis. He will consider changing to these agents and let me know. For now he will continue Coumadin. Most recent INR 2.46 on 06/05/2014. Current Coumadin dose 5 mg daily except 7.5 mg on Mondays.   CC: Patient Care Team: Lonzo Cloudaghu Ram, MD as PCP - General (Family Medicine)   Levert FeinsteinGRANFORTUNA,JAMES M, MD 2/23/20164:04 PM

## 2014-06-05 NOTE — Patient Instructions (Signed)
To lab today Return visit 1 year  

## 2014-06-07 ENCOUNTER — Telehealth: Payer: Self-pay | Admitting: *Deleted

## 2014-06-07 NOTE — Telephone Encounter (Signed)
Pt called / informed labs all normal per Dr Cyndie ChimeGranfortuna. Pt requested labs to be mailed to him; informed pt, he can go to My Chart to view labs. Stated he did not know about My Chart - told him to look at last page of AVS and follow instructions. Stated he will do so.

## 2014-06-07 NOTE — Telephone Encounter (Signed)
-----   Message from Levert FeinsteinJames M Granfortuna, MD sent at 06/06/2014  1:58 PM EST ----- Call pt: lab all normal

## 2014-06-12 ENCOUNTER — Ambulatory Visit (INDEPENDENT_AMBULATORY_CARE_PROVIDER_SITE_OTHER): Payer: BC Managed Care – PPO | Admitting: Pharmacist

## 2014-06-12 DIAGNOSIS — D6859 Other primary thrombophilia: Secondary | ICD-10-CM

## 2014-06-12 DIAGNOSIS — Z7901 Long term (current) use of anticoagulants: Secondary | ICD-10-CM

## 2014-06-12 LAB — POCT INR: INR: 2.46

## 2014-06-12 NOTE — Patient Instructions (Addendum)
INR at goal  No changes Continue with 5mg  daily except 7.5mg  on one day of the week.  Recheck PT/INR on 08/08/14; 2pm for lab and 2:15pm for Coumadin clinic.

## 2014-06-12 NOTE — Progress Notes (Signed)
*  No charge - telephone encounter* INR results from 06/05/14 from office visit with Dr. Cyndie ChimeGranfortuna Pt is doing well with no complaints No missed or extra doses No diet or medication changes No unusual bleeding or bruising Pt originally scheduled for appointment on 3/2 but this was prior to Mr. Audree CamelKuila setting up his office visit with Dr. Cyndie ChimeGranfortuna last week Plans: Continue with 5mg  daily except 7.5mg  on one day of the week.   Recheck PT/INR on 08/08/14; 2pm for lab and 2:15pm for Coumadin clinic.

## 2014-08-08 ENCOUNTER — Ambulatory Visit: Payer: BC Managed Care – PPO

## 2014-08-08 ENCOUNTER — Ambulatory Visit (INDEPENDENT_AMBULATORY_CARE_PROVIDER_SITE_OTHER): Payer: BC Managed Care – PPO | Admitting: Pharmacist

## 2014-08-08 ENCOUNTER — Other Ambulatory Visit (HOSPITAL_BASED_OUTPATIENT_CLINIC_OR_DEPARTMENT_OTHER): Payer: BC Managed Care – PPO

## 2014-08-08 DIAGNOSIS — Z7901 Long term (current) use of anticoagulants: Secondary | ICD-10-CM

## 2014-08-08 DIAGNOSIS — D6859 Other primary thrombophilia: Secondary | ICD-10-CM

## 2014-08-08 LAB — PROTIME-INR
INR: 2.4 (ref 2.00–3.50)
Protime: 28.8 Seconds — ABNORMAL HIGH (ref 10.6–13.4)

## 2014-08-08 LAB — POCT INR: INR: 2.4

## 2014-08-08 NOTE — Progress Notes (Signed)
**  NO charge telephone encounter--Dr Granfortuna pt**  INR remains at goal.  No med changes.  No bleeding/bruising.  Will continue coumadin 5mg  daily and 7.5mg  1/week.  Will check PT/INR in 2 months.

## 2014-08-24 ENCOUNTER — Telehealth: Payer: Self-pay | Admitting: Pharmacist

## 2014-08-24 DIAGNOSIS — D6859 Other primary thrombophilia: Secondary | ICD-10-CM

## 2014-08-24 DIAGNOSIS — Z7901 Long term (current) use of anticoagulants: Secondary | ICD-10-CM

## 2014-08-24 MED ORDER — WARFARIN SODIUM 5 MG PO TABS: ORAL_TABLET | ORAL | Status: DC

## 2014-08-24 NOTE — Telephone Encounter (Signed)
Pt called for refill of Coumadin & reports skin itching. He was in [place].  He may have used a different soap while there but otherwise nothing new here at home. He is using Lubriderm & Eucerin for moisturizer. No rashes present, he just notices redness when he itches the irritated areas. He asked if he should take an antihistamine but I advised against that as it could be sedating.  He is not kept awake at night by the itching. I advised him to contact his dermatologist. He is aware his next lab/CC appts here are for 09/27/14. RX for Coumadin refilled as requested. Ebony HailGinna Lilee Aldea, Pharm.D., CPP 08/24/2014@1 :25 PM

## 2014-09-27 ENCOUNTER — Other Ambulatory Visit (HOSPITAL_BASED_OUTPATIENT_CLINIC_OR_DEPARTMENT_OTHER): Payer: BC Managed Care – PPO

## 2014-09-27 ENCOUNTER — Ambulatory Visit (HOSPITAL_BASED_OUTPATIENT_CLINIC_OR_DEPARTMENT_OTHER): Payer: BC Managed Care – PPO | Admitting: Pharmacist

## 2014-09-27 DIAGNOSIS — D6859 Other primary thrombophilia: Secondary | ICD-10-CM

## 2014-09-27 DIAGNOSIS — D6852 Prothrombin gene mutation: Secondary | ICD-10-CM

## 2014-09-27 DIAGNOSIS — Z7901 Long term (current) use of anticoagulants: Secondary | ICD-10-CM

## 2014-09-27 LAB — POCT INR: INR: 1.8

## 2014-09-27 LAB — PROTIME-INR
INR: 1.8 — ABNORMAL LOW (ref 2.00–3.50)
Protime: 21.6 Seconds — ABNORMAL HIGH (ref 10.6–13.4)

## 2014-09-27 NOTE — Progress Notes (Signed)
*  Telephone Encounter Only - No Charge* INR = 1.8   Goal 2-3 INR below goal range. Patient states he missed a dose of Coumadin yesterday, this is likely the reason for the decrease in his INR. He has been stable for several months at the current dose. He came in to the lab and had his INR drawn, but then had to go back to the college and missed him his appt this am. He will continue with 5mg  daily except 7.5mg  on one day of the week. Recheck PT/INR on 11/28/14; 11am for lab and 11:15am for Coumadin clinic.  Cletis Athens, PharmD

## 2014-11-28 ENCOUNTER — Telehealth: Payer: Self-pay | Admitting: Pharmacist

## 2014-11-28 ENCOUNTER — Ambulatory Visit (HOSPITAL_BASED_OUTPATIENT_CLINIC_OR_DEPARTMENT_OTHER): Payer: BC Managed Care – PPO | Admitting: Pharmacist

## 2014-11-28 ENCOUNTER — Other Ambulatory Visit: Payer: BC Managed Care – PPO

## 2014-11-28 DIAGNOSIS — D6859 Other primary thrombophilia: Secondary | ICD-10-CM

## 2014-11-28 DIAGNOSIS — Z7901 Long term (current) use of anticoagulants: Secondary | ICD-10-CM

## 2014-11-28 NOTE — Telephone Encounter (Signed)
Patient FTKA today for lab and coumadin clinic visit. Called and left VM for patient to call back and reschedule   Thank you,  Christell Faith, PharmD, BCOP

## 2014-11-29 LAB — POCT INR: INR: 2.1

## 2014-11-29 NOTE — Patient Instructions (Signed)
Continue with  daily except 7.5mg  on one day of the week.  Recheck PT/INR on 01/28/15 with Dr.G [facility].

## 2014-11-29 NOTE — Progress Notes (Signed)
Pt called as he realized he missed his CC for 11/28/14 He states he had his INR done a few weeks ago and INR was 2.1 and that he forgot to call us (I can not find record of this INR) He had rec'd CC letter stating the change and aware he needs to transfer to Dr. Patsy Lager  New CC service  His dose is  daily with 7.5 mg on Monday's

## 2015-05-01 ENCOUNTER — Ambulatory Visit: Payer: BC Managed Care – PPO | Admitting: Pharmacist

## 2015-05-14 ENCOUNTER — Other Ambulatory Visit: Payer: Self-pay | Admitting: *Deleted

## 2015-05-14 DIAGNOSIS — D6859 Other primary thrombophilia: Secondary | ICD-10-CM

## 2015-05-14 DIAGNOSIS — Z7901 Long term (current) use of anticoagulants: Secondary | ICD-10-CM

## 2015-05-14 MED ORDER — WARFARIN SODIUM 5 MG PO TABS: ORAL_TABLET | ORAL | Status: DC

## 2015-05-14 NOTE — Telephone Encounter (Signed)
Stated his INR is 2.1 - had it done at his PCP office week of the 9th.

## 2015-05-20 ENCOUNTER — Encounter: Payer: BC Managed Care – PPO | Admitting: Oncology

## 2015-10-08 ENCOUNTER — Encounter: Payer: BC Managed Care – PPO | Admitting: Oncology

## 2015-10-14 ENCOUNTER — Encounter: Payer: Self-pay | Admitting: Oncology

## 2015-10-14 ENCOUNTER — Ambulatory Visit (INDEPENDENT_AMBULATORY_CARE_PROVIDER_SITE_OTHER): Payer: BC Managed Care – PPO | Admitting: Oncology

## 2015-10-14 ENCOUNTER — Ambulatory Visit (INDEPENDENT_AMBULATORY_CARE_PROVIDER_SITE_OTHER): Payer: BC Managed Care – PPO | Admitting: Pharmacist

## 2015-10-14 VITALS — BP 144/83 | HR 65 | Temp 97.7°F | Ht 67.0 in | Wt 157.2 lb

## 2015-10-14 DIAGNOSIS — D6859 Other primary thrombophilia: Secondary | ICD-10-CM

## 2015-10-14 DIAGNOSIS — Z7901 Long term (current) use of anticoagulants: Secondary | ICD-10-CM

## 2015-10-14 DIAGNOSIS — D688 Other specified coagulation defects: Secondary | ICD-10-CM

## 2015-10-14 LAB — POCT INR: INR: 2

## 2015-10-14 NOTE — Patient Instructions (Signed)
Patient instructed to take medications as defined in the Anti-coagulation Track section of this encounter.  Patient instructed to take today's dose.  Patient verbalized understanding of these instructions.    

## 2015-10-14 NOTE — Progress Notes (Signed)
Anti-Coagulation Progress Note  Albert Wolfe is a 60 y.o. male who is currently on an anti-coagulation regimen.    RECENT RESULTS: Recent results are below, the most recent result is correlated with a dose of 37.5 mg. per week: Lab Results  Component Value Date   INR 2.0 10/14/2015   INR 2.1 11/29/2014   INR 1.80* 09/27/2014   PROTIME 21.6* 09/27/2014    ANTI-COAG DOSE: Anticoagulation Dose Instructions as of 10/14/2015      Glynis SmilesSun Mon Tue Wed Thu Fri Sat   New Dose 5 mg 7.5 mg 5 mg 5 mg 7.5 mg 5 mg 5 mg       ANTICOAG SUMMARY: Anticoagulation Episode Summary    Current INR goal 2.0-3.0  Next INR check 12/13/2015  INR from last check 2.0 (10/14/2015)  Weekly max dose   Target end date   INR check location   Preferred lab   Send INR reminders to RX [facility]   Indications  Chronic anticoagulation [Z79.01] Protein S deficiency [facility](HCC) [D68.59]        Comments       Anticoagulation Care Providers    Provider Role Specialty Phone number   Levert FeinsteinJames M Granfortuna, MD Referring Oncology [phone]      ANTICOAG TODAY: Anticoagulation Summary as of 10/14/2015    INR goal 2.0-3.0  Selected INR 2.0 (10/14/2015)  Next INR check 12/13/2015  Target end date    Indications  Chronic anticoagulation [Z79.01] Protein S deficiency (HCC) [D68.59]      Anticoagulation Episode Summary    INR check location    Preferred lab    [facility]   Comments     Anticoagulation Care Providers    Provider Role Specialty Phone number   Levert FeinsteinJames M Granfortuna, MD Referring Oncology [phone]      PATIENT INSTRUCTIONS: Patient Instructions  Patient instructed to take medications as defined in the Anti-coagulation Track section of this encounter.  Patient instructed to take today's dose.  Patient verbalized understanding of these instructions.       FOLLOW-UP Return in 2 months (on 12/13/2015) for Follow up INR at 1:45PM.  Hulen LusterJames Dreyton Roessner, III Pharm.D.,  CACP

## 2015-10-14 NOTE — Progress Notes (Signed)
Reviewed. Thanks. DrG Did you have a chance to talk with him about a NOAC? I see we had this discussion last year when I saw him,

## 2015-10-14 NOTE — Progress Notes (Signed)
Patient ID: Albert Wolfe Wildermuth, male   DOB: 06/15/1955, 60 y.o.   MRN: [id number] Hematology and Oncology Follow Up Visit  Albert Wolfe Crespi [id number] 02/10/1956 60 y.o. 10/14/2015 9:18 AM   Principle Diagnosis: Encounter Diagnoses  Name Primary?  . Chronic anticoagulation Yes  . 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 [place] and presented with recurrent DVTs. Further evaluation showed that he has protein S deficiency. He has been on chronic Coumadin anticoagulation since that time. At time of his 06/05/14 visit,he had not kept a scheduled appointment with me in over 5 years. When [facility] Coumad[facility] clinic closed, he started to get his pro time checks done intermittently through his primary care physician's office. Last INR on record in our system was 2.1 on 11/29/2014.  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 He wears elastic stockings on a regular basis.  One of his sons developed a DVT at age 62 and is currently on warfarin. His other son is also affected and takes aspirin prophylactically but has not had any blood clots yet.   Interim History:   He is doing well and has had no interim medical problems. He denies any chest pain or dyspnea. He has chronic problems with venous stasis changes and overlying dermatitis of his left ankle. swelling or tenderness. Current Coumadin dose 5 mg daily except 7.5 mg once a week. He admits he adjusts his own Coumadin and takes extra doses when he travels. 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 He will be traveling to [place] soon. Unfortunately, one of his  sisters sons age 62 recently died of complications of refractory testicular cancer.  Medications: reviewed  Allergies:  Allergies  Allergen Reactions  . Erythromycin Diarrhea  . Ibuprofen Diarrhea and Other (See Comments)    Ibuprofen causes gastric upset.    Review of Systems: No dyspnea, no chest pain, no change in bowel habit, last colonoscopy 2009. Remaining ROS negative:   Physical Exam: Blood pressure 144/83, pulse 65, temperature 97.7 F (36.5 C), temperature source Oral, height 5\' 7"  (1.702 m), weight 157 lb 3.2 oz (71.305 kg), SpO2 98 %. Wt Readings from Last 3 Encounters:  10/14/15 157 lb 3.2 oz (71.305 kg)  06/05/14 158 lb 3.2 oz (71.759 kg)  12/21/12 153 lb 11.2 oz (69.718 kg)     General appearance: well nourished [place] man HENNT: Pharynx no erythema, exudate, mass, or ulcer. No thyromegaly or thyroid nodules Lymph nodes: No cervical, supraclavicular, or axillary lymphadenopathy Breasts: Lungs: Clear to auscultation, resonant to percussion throughout Heart: Regular rhythm, no murmur, no gallop, no rub, no click, no edema Abdomen: Soft, nontender, normal bowel sounds, no mass, no organomegaly Extremities: No edema, no calf tenderness Musculoskeletal: no joint deformities GU:  Vascular: Carotid pulses 2+, no bruits, distal pulses: Dorsalis pedis 2+ left foot Neurologic: Alert, oriented, PERRLA, optic discs sharp and vessels normal, no hemorrhage or exudate, cranial nerves grossly normal, motor strength 5 over 5, reflexes 1+ symmetric, upper body coordination normal, gait normal, Skin: No rash or ecchymosis. Advanced venous stasis changes medial left ankle with 3x3 cm area of overlying scaly  dermatitis  Lab Results: CBC W/Diff    Component Value Date/Time   WBC 6.3 06/05/2014 1531   WBC 6.6 03/14/2010 1125   RBC 4.95 06/05/2014 1531   RBC 5.25 03/14/2010 1125   HGB 14.8 06/05/2014 1531   HGB 16.0 03/14/2010 1125   HCT 44.1 06/05/2014 1531   HCT 45.0  03/14/2010 1125   PLT 182 06/05/2014 1531   PLT 167 03/14/2010 1125   MCV 89.1 06/05/2014 1531   MCV 85.7 03/14/2010 1125   MCH 29.9 06/05/2014 1531   MCH 30.5 03/14/2010 1125   MCHC 33.6 06/05/2014 1531   MCHC 35.6 03/14/2010 1125   RDW 13.3 06/05/2014 1531   RDW 12.1 03/14/2010 1125   LYMPHSABS 2.1 06/05/2014 1531   LYMPHSABS 2.0 03/14/2010 1125   MONOABS 0.4 06/05/2014 1531   MONOABS 0.3 03/14/2010 1125   EOSABS 0.1 06/05/2014 1531   EOSABS 0.2 03/14/2010 1125   BASOSABS 0.1 06/05/2014 1531   BASOSABS 0.1 03/14/2010 1125     Chemistry      Component Value Date/Time   NA 136 06/05/2014 1531   K 4.0 06/05/2014 1531   CL 100 06/05/2014 1531   CO2 28 06/05/2014 1531   BUN 16 06/05/2014 1531   CREATININE 0.78 06/05/2014 1531      Component Value Date/Time   CALCIUM 9.1 06/05/2014 1531   ALKPHOS 82 06/05/2014 1531   AST 24 06/05/2014 1531   ALT 24 06/05/2014 1531   BILITOT 0.4 06/05/2014 1531       Radiological Studies: No results found.  Impression:  #1. 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 Chronic venous stasis changes left lower extremity secondary to #2 #4. Chronic anticoagulation secondary to #1 and 2.  I introduced him to our clinical pharmacist Dr. Chancy MilroyJay Groce, who will cover the discussion that I had with the patient last year about changing to a novel oral anticoagulant. I think this will suit his lifestyle better and also be safer with respect to minimizing long-term bleeding complications on chronic anticoagulation. We will check a PT/INR and a CBC today. I will see him again in one year unless he plans any elective surgical procedures and then I will work with his Careers advisersurgeon.  CC: Patient Care Team: Lonzo Cloudaghu Ram, MD as PCP - General (Family Medicine)   Levert FeinsteinJAMES M GRANFORTUNA, MD 7/3/20179:18 AM

## 2015-10-14 NOTE — Patient Instructions (Signed)
To lab today Please establish with our coumadin clinic for Q 2 month PT/INR, Q 6 month CBC Return visit 12 months

## 2015-10-15 LAB — CBC WITH DIFFERENTIAL/PLATELET
Basophils Absolute: 0.1 10*3/uL (ref 0.0–0.2)
Basos: 1 %
EOS (ABSOLUTE): 0.2 10*3/uL (ref 0.0–0.4)
EOS: 3 %
HEMATOCRIT: 43.4 % (ref 37.5–51.0)
Hemoglobin: 14.8 g/dL (ref 12.6–17.7)
IMMATURE GRANULOCYTES: 0 %
Immature Grans (Abs): 0 10*3/uL (ref 0.0–0.1)
Lymphocytes Absolute: 1.8 10*3/uL (ref 0.7–3.1)
Lymphs: 34 %
MCH: 29.9 pg (ref 26.6–33.0)
MCHC: 34.1 g/dL (ref 31.5–35.7)
MCV: 88 fL (ref 79–97)
Monocytes Absolute: 0.4 10*3/uL (ref 0.1–0.9)
Monocytes: 8 %
Neutrophils Absolute: 2.9 10*3/uL (ref 1.4–7.0)
Neutrophils: 54 %
Platelets: 180 10*3/uL (ref 150–379)
RBC: 4.95 x10E6/uL (ref 4.14–5.80)
RDW: 13 % (ref 12.3–15.4)
WBC: 5.3 10*3/uL (ref 3.4–10.8)

## 2016-01-08 ENCOUNTER — Other Ambulatory Visit: Payer: Self-pay | Admitting: Oncology

## 2016-01-08 DIAGNOSIS — Z7901 Long term (current) use of anticoagulants: Secondary | ICD-10-CM

## 2016-01-08 DIAGNOSIS — D6859 Other primary thrombophilia: Secondary | ICD-10-CM

## 2016-10-12 ENCOUNTER — Telehealth: Payer: Self-pay

## 2016-10-12 ENCOUNTER — Other Ambulatory Visit: Payer: Self-pay | Admitting: Oncology

## 2016-10-12 DIAGNOSIS — Z7901 Long term (current) use of anticoagulants: Secondary | ICD-10-CM

## 2016-10-12 DIAGNOSIS — D6859 Other primary thrombophilia: Secondary | ICD-10-CM

## 2016-10-12 NOTE — Telephone Encounter (Signed)
Dr Reece AgarG is currently out, by my review he has not been seen in our clinic for INR check with Dr Alexandria LodgeGroce in 1 year (may have been getting checks at his PCP office).  I would like him to be scheduled to see Dr Alexandria LodgeGroce for an INR check in the next 2 weeks.

## 2016-10-13 NOTE — Telephone Encounter (Signed)
Appt has been scheduled 10/26/16@ 1030 AM w/Dr Alexandria LodgeGroce.

## 2016-10-20 ENCOUNTER — Other Ambulatory Visit: Payer: Self-pay | Admitting: Oncology

## 2016-10-20 ENCOUNTER — Other Ambulatory Visit: Payer: Self-pay

## 2016-10-20 DIAGNOSIS — D6859 Other primary thrombophilia: Secondary | ICD-10-CM

## 2016-10-20 DIAGNOSIS — Z7901 Long term (current) use of anticoagulants: Secondary | ICD-10-CM

## 2016-10-20 NOTE — Telephone Encounter (Signed)
warfarin (COUMADIN) 5 MG tablet, refill request @ [facility]. Pt is going out of the country 10/21/2016, will returned on 11/16/2016. Requesting med to be filled, please call pt back.

## 2016-10-20 NOTE — Telephone Encounter (Signed)
Called pt - stated [facility] sees Dr Felipa EthAvva, who checks his INR. Informed Dr Reece AgarG has not seen him over a year nor had his INR been checked here since that time also. Informed pt to call Dr Vicente MalesAvva's office  to refill Coumadin.  Stated Dr Reece AgarG needs to refill - he will fax over INR results. Told him I will inform Dr Reece AgarG and go from there.

## 2016-10-21 NOTE — Telephone Encounter (Signed)
Noted. Thanks.

## 2016-10-21 NOTE — Telephone Encounter (Signed)
Called pt - stated he had called [facility] to fax results also he had INR done yesterday. Informed pt , we have not received any labs form [facility]. Then stated Dr Felipa EthAvva refilled Coumadin rx ; he could not wait since going out of country today.

## 2016-10-26 ENCOUNTER — Ambulatory Visit: Payer: BC Managed Care – PPO

## 2016-11-23 ENCOUNTER — Ambulatory Visit: Payer: BC Managed Care – PPO

## 2016-11-30 ENCOUNTER — Encounter: Payer: BC Managed Care – PPO | Admitting: Oncology

## 2016-12-07 ENCOUNTER — Encounter: Payer: BC Managed Care – PPO | Admitting: Oncology

## 2016-12-28 ENCOUNTER — Ambulatory Visit (INDEPENDENT_AMBULATORY_CARE_PROVIDER_SITE_OTHER): Payer: BC Managed Care – PPO | Admitting: Oncology

## 2016-12-28 ENCOUNTER — Encounter: Payer: Self-pay | Admitting: Oncology

## 2016-12-28 ENCOUNTER — Telehealth: Payer: Self-pay | Admitting: *Deleted

## 2016-12-28 VITALS — BP 135/85 | HR 66 | Temp 98.2°F | Ht 67.0 in | Wt 150.1 lb

## 2016-12-28 DIAGNOSIS — Z7901 Long term (current) use of anticoagulants: Secondary | ICD-10-CM | POA: Diagnosis not present

## 2016-12-28 DIAGNOSIS — D6859 Other primary thrombophilia: Secondary | ICD-10-CM | POA: Diagnosis not present

## 2016-12-28 DIAGNOSIS — Z23 Encounter for immunization: Secondary | ICD-10-CM

## 2016-12-28 LAB — COMPREHENSIVE METABOLIC PANEL
ALBUMIN: 4.2 g/dL (ref 3.5–5.0)
ALT: 25 U/L (ref 17–63)
ANION GAP: 6 (ref 5–15)
AST: 30 U/L (ref 15–41)
Alkaline Phosphatase: 77 U/L (ref 38–126)
BUN: 14 mg/dL (ref 6–20)
CALCIUM: 9.1 mg/dL (ref 8.9–10.3)
CHLORIDE: 102 mmol/L (ref 101–111)
CO2: 25 mmol/L (ref 22–32)
Creatinine, Ser: 0.9 mg/dL (ref 0.61–1.24)
GFR calc non Af Amer: >60 mL/min (ref >60)
GLUCOSE: 95 mg/dL (ref 65–99)
Potassium: 4.2 mmol/L (ref 3.5–5.1)
SODIUM: 133 mmol/L — AB (ref 135–145)
Total Bilirubin: 0.6 mg/dL (ref 0.3–1.2)
Total Protein: 6.6 g/dL (ref 6.5–8.1)

## 2016-12-28 LAB — CBC WITH DIFFERENTIAL/PLATELET
BASOS PCT: 1 %
Basophils Absolute: 0 10*3/uL (ref 0.0–0.1)
EOS ABS: 0.2 10*3/uL (ref 0.0–0.7)
EOS PCT: 3 %
HCT: 42.5 % (ref 39.0–52.0)
HEMOGLOBIN: 14.2 g/dL (ref 13.0–17.0)
Lymphocytes Relative: 26 %
Lymphs Abs: 1.3 10*3/uL (ref 0.7–4.0)
MCH: 28.9 pg (ref 26.0–34.0)
MCHC: 33.4 g/dL (ref 30.0–36.0)
MCV: 86.6 fL (ref 78.0–100.0)
Monocytes Absolute: 0.3 10*3/uL (ref 0.1–1.0)
Monocytes Relative: 6 %
Neutro Abs: 3.3 10*3/uL (ref 1.7–7.7)
Neutrophils Relative %: 64 %
PLATELETS: 160 10*3/uL (ref 150–400)
RBC: 4.91 MIL/uL (ref 4.22–5.81)
RDW: 12.8 % (ref 11.5–15.5)
WBC: 5.1 10*3/uL (ref 4.0–10.5)

## 2016-12-28 LAB — PROTIME-INR
INR: 3.08
Prothrombin Time: 31.5 seconds — ABNORMAL HIGH (ref 11.4–15.2)

## 2016-12-28 NOTE — Addendum Note (Signed)
Addended by: Bufford Spikes on: 12/28/2016 10:43 AM   Modules accepted: Orders

## 2016-12-28 NOTE — Progress Notes (Signed)
Hematology and Oncology Follow Up Visit  Albert Wolfe [id number] [birth year] 61 y.o. 12/28/2016 10:35 AM   Principle Diagnosis: Encounter Diagnoses  Name Primary?  . Protein S deficiency (HCC) Yes  . Chronic anticoagulation   . 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 He moved to [facility] in September 2006 and presented with recurrent DVTs. Further evaluation showed that he has protein S deficiency. He has been on chronic Coumadin anticoagulation since that time. At time of his 06/05/14 visit,he had not kept a scheduled appointment with me in over 5 years. When the [facility] Coumadin clinic closed, he started to get his pro time checks done intermittently through his primary care physician's office.   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 He wears elastic stockings on a regular basis.  One of his sons developed a DVT at age 72 and is currently on warfarin. His other son is also affected and takes aspirin prophylactically but has not had any blood clots yet.   Interim History:   Overall doing well with no interim medical problems. He continues to have venous stasis of the left foot and skin threatens to breakdown and ulcerated. No leg swelling or pain. No dyspnea or chest pain. He recently took a long trip to [place] in July and had no problems.  Medications: reviewed  Allergies:  Allergies  Allergen Reactions  . Erythromycin Diarrhea  . Ibuprofen Diarrhea and Other (See Comments)    Ibuprofen causes gastric upset.    Review of Systems:  Remaining ROS negative:   Physical Exam: Blood pressure 135/85, pulse 66, temperature 98.2 F (36.8 C), temperature source Oral, height  (1.702 m), weight 150 lb 1.6 oz (68.1 kg), SpO2 99  %. Wt Readings from Last 3 Encounters:  12/28/16 150 lb 1.6 oz (68.1 kg)  10/14/15 157 lb 3.2 oz (71.3 kg)  06/05/14 158 lb 3.2 oz (71.8 kg)     General appearance: Well-nourished [place] male HENNT: Pharynx no erythema, exudate, mass, or ulcer. No thyromegaly or thyroid nodules Lymph nodes: No cervical, supraclavicular, or axillary lymphadenopathy Breasts:  Lungs: Clear to auscultation, resonant to percussion throughout Heart: Regular rhythm, no murmur, no gallop, no rub, no click, no edema Abdomen: Soft, nontender, normal bowel sounds, no mass, no organomegaly Extremities: No edema, no calf tenderness Musculoskeletal: no joint deformities Chronic venous stasis changes medial aspect left foot. No active ulceration at this time. Calf measurement: 32 cm on the left, 32 cm on the right GU:  Vascular: Carotid pulses 2+, no bruits, distal pulses: Dorsalis pedis 2+,Posterior tibial 2+ left foot Neurologic: Alert, oriented, PERRLA, optic discs sharp and vessels normal, no hemorrhage or exudate, cranial nerves grossly normal, motor strength 5 over 5, reflexes 1+ symmetric, upper body coordination normal, gait normal, Skin: No rash or ecchymosis  Lab Results: CBC W/Diff    Component Value Date/Time   WBC 5.3 10/14/2015 0923   WBC 6.3 06/05/2014 1531   RBC 4.95 10/14/2015 0923   RBC 4.95 06/05/2014 1531   HGB 14.8 10/14/2015 0923   HGB 16.0 03/14/2010 1125   HCT 43.4 10/14/2015 0923   HCT 45.0 03/14/2010 1125   PLT 180 10/14/2015 0923   MCV 88 10/14/2015 0923   MCV 85.7 03/14/2010 1125   MCH 29.9 10/14/2015 0923  MCH 29.9 06/05/2014 1531   MCHC 34.1 10/14/2015 0923   MCHC 33.6 06/05/2014 1531   RDW 13.0 10/14/2015 0923   RDW 12.1 03/14/2010 1125   LYMPHSABS 1.8 10/14/2015 0923   LYMPHSABS 2.0 03/14/2010 1125   MONOABS 0.4 06/05/2014 1531   MONOABS 0.3 03/14/2010 1125   EOSABS 0.2 10/14/2015 0923   BASOSABS 0.1 10/14/2015 0923   BASOSABS 0.1 03/14/2010 1125     Chemistry       Component Value Date/Time   NA 136 06/05/2014 1531   K 4.0 06/05/2014 1531   CL 100 06/05/2014 1531   CO2 28 06/05/2014 1531   BUN 16 06/05/2014 1531   CREATININE 0.78 06/05/2014 1531      Component Value Date/Time   CALCIUM 9.1 06/05/2014 1531   ALKPHOS 82 06/05/2014 1531   AST 24 06/05/2014 1531   ALT 24 06/05/2014 1531   BILITOT 0.4 06/05/2014 1531       Radiological Studies: No results found.  Impression:  #1. 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 Chronic venous stasis changes left lower extremity secondary to #2 #4. Chronic anticoagulation secondary to #1 and 2. #5. Flu vaccine given at his request today.  We addressed changing to a oral XA inhibitor again. He is much more inclined to do this since we now have a reversal agent. I told him that even in the absence of a reversal agent we have not had any major problems with these medications since they are cleared from the system within 24 hours in people with normal renal function. They continue to show advantages over warfarin in many areas including drug interactions, dietary interactions, and decreased major bleeding over time in people who need to be on a long-term anticoagulant. He will reconsider.   CC: Patient Care Team: Lonzo Cloud, MD as PCP - General (Family Medicine)   Cephas Darby, MD, FACP  Hematology-Oncology/Internal Medicine     9/17/201810:35 AM

## 2016-12-28 NOTE — Telephone Encounter (Signed)
Pt called /informed "lab all good. INR a little high at 3.08" per Dr Cyndie Chime.  Stated currently he's taking 5 mg every day except on Mon/Fri - he takes 7.5 mg. Asked about who he wants to adjust Coumadin - hesitated, finally stated he will ask his PCP. Renforced, the one who adjust his Coumadin also will be the one to refill his rxs  - voiced understanding.

## 2016-12-28 NOTE — Addendum Note (Signed)
Addended by: Bufford Spikes on: 12/28/2016 10:44 AM   Modules accepted: Orders

## 2016-12-28 NOTE — Telephone Encounter (Signed)
-----   Message from Levert Feinstein, MD sent at 12/28/2016  2:26 PM EDT ----- Call pt lab all good. INR a little high at 3.08. What is current dose of coumadin? Does he want [place] to adjust or his primary?

## 2016-12-28 NOTE — Telephone Encounter (Signed)
Thanks

## 2016-12-28 NOTE — Patient Instructions (Addendum)
To lab today Consider change from coumadin to either Xarelto or Eliquis MD visit in 12 months

## 2017-03-02 ENCOUNTER — Other Ambulatory Visit: Payer: Self-pay

## 2017-03-02 NOTE — Telephone Encounter (Signed)
Would like to speak with a nurse about blood problem. Please call pt back.

## 2017-03-02 NOTE — Telephone Encounter (Signed)
Lm for rtc 

## 2017-03-02 NOTE — Telephone Encounter (Signed)
Returned pt's call - no answer; left message to give us a call back. 

## 2017-03-10 NOTE — Telephone Encounter (Signed)
Lm for rtc 

## 2017-04-21 LAB — PROTIME-INR

## 2019-05-25 ENCOUNTER — Ambulatory Visit: Payer: BC Managed Care – PPO | Attending: Family

## 2019-05-25 DIAGNOSIS — Z23 Encounter for immunization: Secondary | ICD-10-CM | POA: Insufficient documentation

## 2019-05-25 NOTE — Progress Notes (Addendum)
   Covid-19 Vaccination Clinic  Name:  Albert Wolfe    MRN: [id number] DOB: Jul 05, 1955  05/25/2019  [name] was observed post Covid-19 immunization for 30 minutes based on pre-vaccination screening without incidence. He was provided with Vaccine Information Sheet and instruction to access the V-Safe system.   [name] was instructed to call 911 with any severe reactions post vaccine: Marland Kitchen Difficulty breathing  . Swelling of your face and throat  . A fast heartbeat  . A bad rash all over your body  . Dizziness and weakness    Immunizations Administered    Name Date Dose VIS Date Route   Moderna COVID-19 Vaccine 05/25/2019 11:59 AM 0.5 mL 03/14/2019 Intramuscular   Manufacturer: Moderna   Lot: [id number]   NDC: 56812-751-70

## 2019-06-27 ENCOUNTER — Ambulatory Visit: Payer: BC Managed Care – PPO | Attending: Internal Medicine

## 2019-06-27 DIAGNOSIS — Z23 Encounter for immunization: Secondary | ICD-10-CM

## 2019-06-27 NOTE — Progress Notes (Signed)
   Covid-19 Vaccination Clinic  Name:  Albert Wolfe    MRN: [id number] DOB: November 08, 1955  06/27/2019  [name] was observed post Covid-19 immunization for 15 minutes without incident. He was provided with Vaccine Information Sheet and instruction to access the V-Safe system.   [name] was instructed to call 911 with any severe reactions post vaccine: Marland Kitchen Difficulty breathing  . Swelling of face and throat  . A fast heartbeat  . A bad rash all over body  . Dizziness and weakness   Immunizations Administered    Name Date Dose VIS Date Route   Moderna COVID-19 Vaccine 06/27/2019 11:26 AM 0.5 mL 03/14/2019 Intramuscular   Manufacturer: Moderna   Lot: [id number]   NDC: 45859-292-44

## 2020-03-26 ENCOUNTER — Ambulatory Visit: Payer: BC Managed Care – PPO | Attending: Internal Medicine

## 2020-03-26 DIAGNOSIS — Z23 Encounter for immunization: Secondary | ICD-10-CM

## 2020-03-26 NOTE — Progress Notes (Signed)
   Covid-19 Vaccination Clinic  Name:  [name]    MRN: [id number] DOB: Jan 25, 1956  03/26/2020  [name] was observed post Covid-19 immunization for 15 minutes without incident. He was provided with Vaccine Information Sheet and instruction to access the V-Safe system.   [name] was instructed to call 911 with any severe reactions post vaccine: Marland Kitchen Difficulty breathing  . Swelling of face and throat  . A fast heartbeat  . A bad rash all over body  . Dizziness and weakness   Immunizations Administered    No immunizations on file.

## 2020-11-22 ENCOUNTER — Other Ambulatory Visit: Payer: Self-pay | Admitting: Internal Medicine

## 2020-11-22 DIAGNOSIS — E786 Lipoprotein deficiency: Secondary | ICD-10-CM

## 2020-12-18 ENCOUNTER — Other Ambulatory Visit: Payer: Self-pay

## 2020-12-18 ENCOUNTER — Ambulatory Visit
Admission: RE | Admit: 2020-12-18 | Discharge: 2020-12-18 | Disposition: A | Discharge status: Home / Self Care | Payer: No Typology Code available for payment source | Source: Ambulatory Visit | Attending: Internal Medicine | Admitting: Internal Medicine

## 2020-12-18 DIAGNOSIS — E786 Lipoprotein deficiency: Secondary | ICD-10-CM

## 2021-02-21 ENCOUNTER — Ambulatory Visit: Payer: No Typology Code available for payment source | Attending: Family

## 2021-02-21 DIAGNOSIS — Z23 Encounter for immunization: Secondary | ICD-10-CM

## 2021-02-24 ENCOUNTER — Ambulatory Visit: Payer: No Typology Code available for payment source

## 2021-02-24 NOTE — Progress Notes (Signed)
   Covid-19 Vaccination Clinic  Name:  Albert Wolfe    MRN: [id number] DOB: 06/12/1955  02/24/2021  Mr. Fagin was observed post Covid-19 immunization for 15 minutes without incident. He was provided with Vaccine Information Sheet and instruction to access the V-Safe system.   Mr. Mabey was instructed to call 911 with any severe reactions post vaccine: Difficulty breathing  Swelling of face and throat  A fast heartbeat  A bad rash all over body  Dizziness and weakness

## 2021-08-01 ENCOUNTER — Other Ambulatory Visit: Payer: Self-pay | Admitting: Orthopedic Surgery

## 2021-09-24 ENCOUNTER — Encounter (HOSPITAL_BASED_OUTPATIENT_CLINIC_OR_DEPARTMENT_OTHER): Payer: Self-pay | Admitting: Orthopedic Surgery

## 2021-10-02 ENCOUNTER — Ambulatory Visit (HOSPITAL_BASED_OUTPATIENT_CLINIC_OR_DEPARTMENT_OTHER)
Admission: RE | Admit: 2021-10-02 | Payer: BC Managed Care – PPO | Source: Ambulatory Visit | Admitting: Orthopedic Surgery

## 2021-10-02 ENCOUNTER — Encounter (HOSPITAL_BASED_OUTPATIENT_CLINIC_OR_DEPARTMENT_OTHER): Admission: RE | Payer: Self-pay | Source: Ambulatory Visit

## 2021-10-02 HISTORY — DX: Gastro-esophageal reflux disease without esophagitis: K21.9

## 2021-10-02 HISTORY — DX: Other primary thrombophilia: D68.59

## 2021-10-02 SURGERY — FASCIECTOMY, PALM
Anesthesia: Choice | Laterality: Right

## 2022-02-12 IMAGING — CT CT CARDIAC CORONARY ARTERY CALCIUM SCORE
3 series · 14 of 20 positions shown, 16 images · non-contrast
Comparison: None.

CLINICAL DATA: 65-year-old Asian male with history of
hyperlipidemia and family history of heart disease.

EXAM:
CT CARDIAC CORONARY ARTERY CALCIUM SCORE
TECHNIQUE: Non-contrast imaging through the heart was performed using
prospective ECG gating. Image post processing was performed on an
independent workstation, allowing for quantitative analysis of the
heart and coronary arteries. Note that this exam targets the heart
and the chest was not imaged in its entirety.

[Series 2: calcium scoring 2.00 qr36 bestdiast 69% hrt calciu · axial · 0.41mm/px · z∈[+1626,+1710]mm · 4 of 70 slices shown]
[im 14/70  vessel]
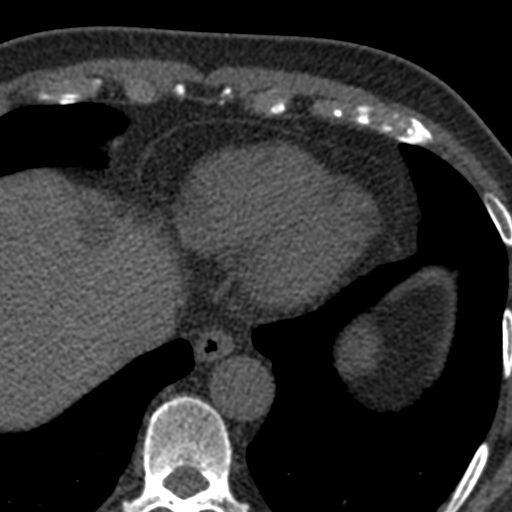
[im 28/70  vessel]
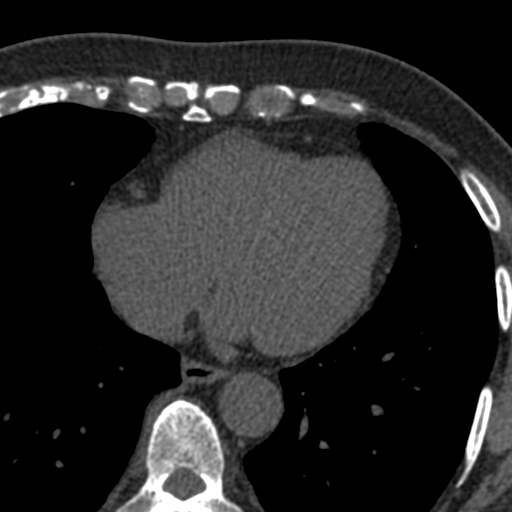
[im 42/70  vessel]
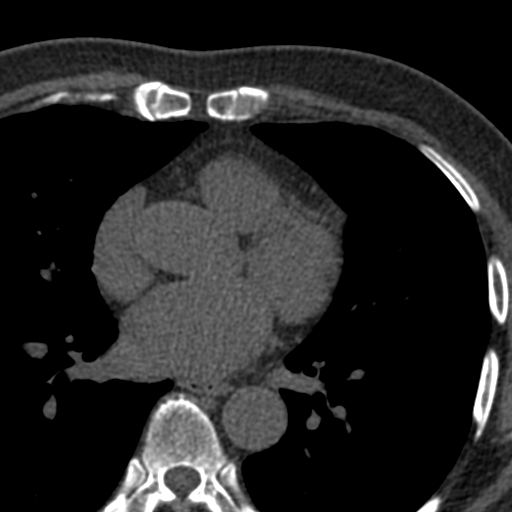
[im 56/70  vessel]
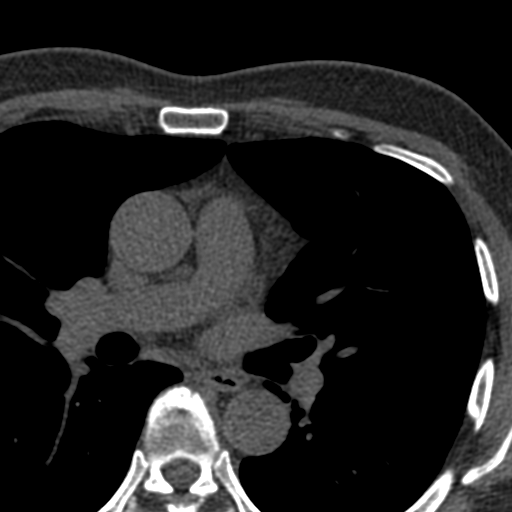

[Series 3: calcium scoring 2.00 br40 bestdiast 69% axial · axial · 0.57mm/px · z∈[+1622,+1714]mm · 5 of 70 slices shown, 7 images]
[im 12/70  vessel]
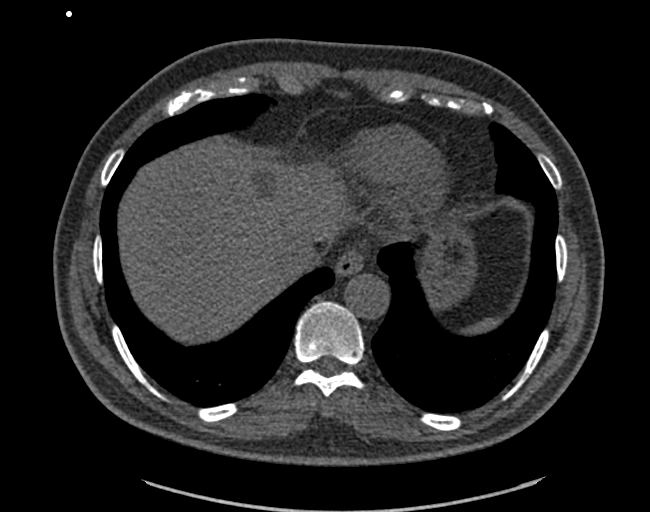
[im 12/70  lung]
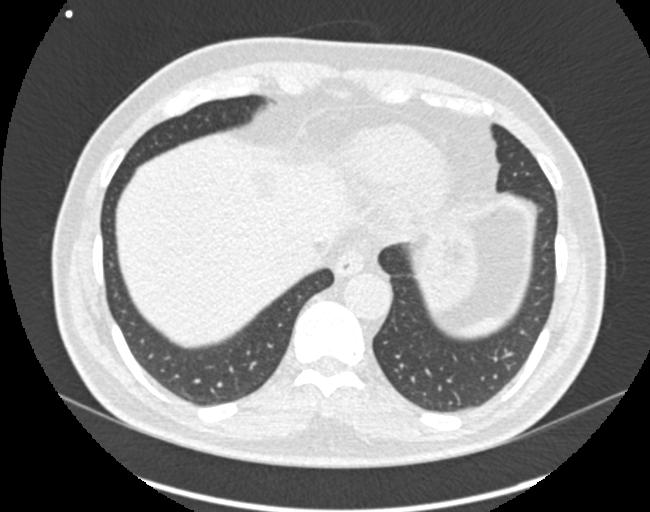
[im 24/70  vessel]
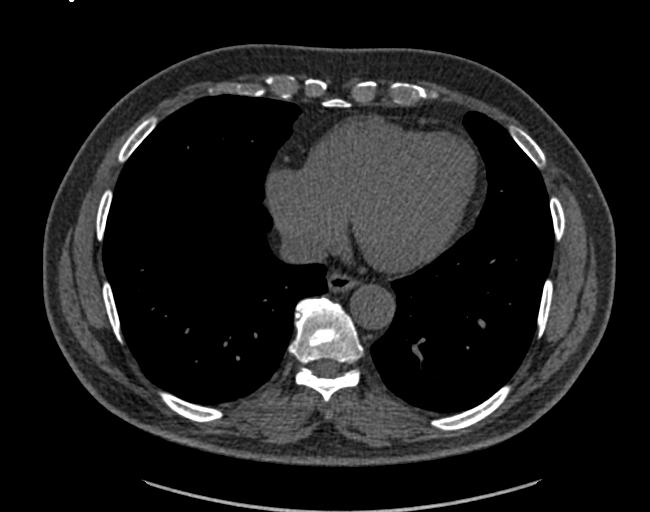
[im 35/70  vessel]
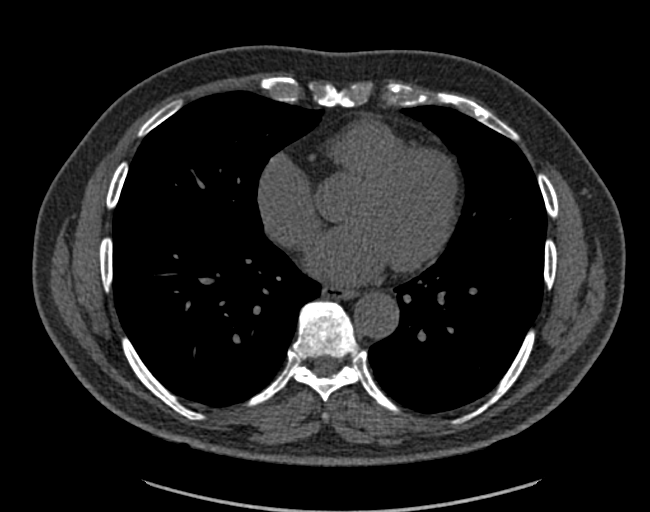
[im 47/70  vessel]
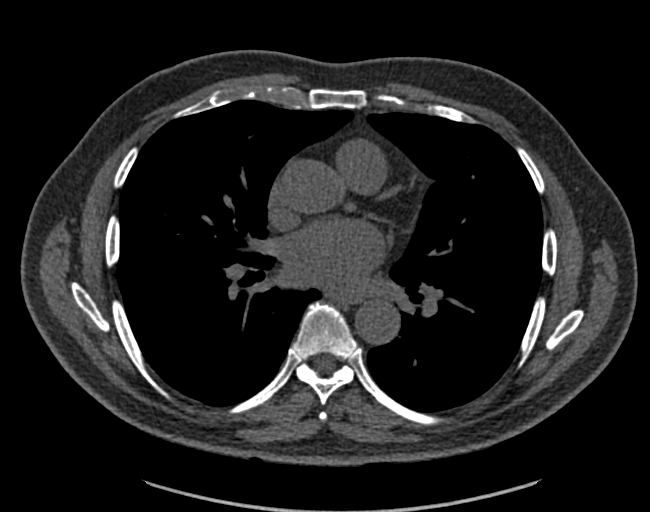
[im 58/70  vessel]
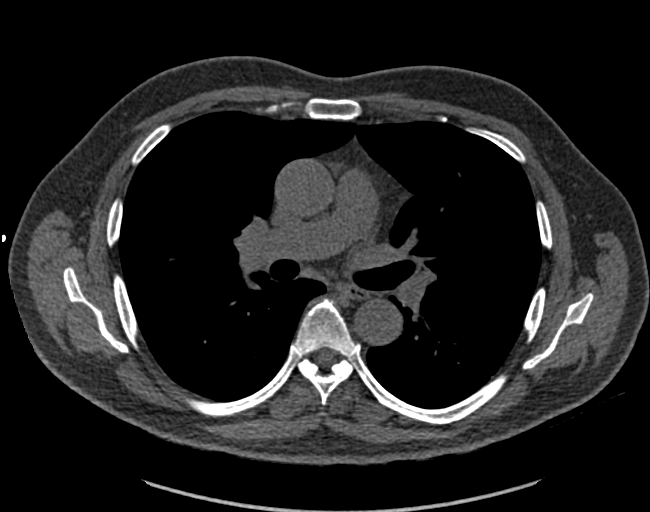
[im 58/70  lung]
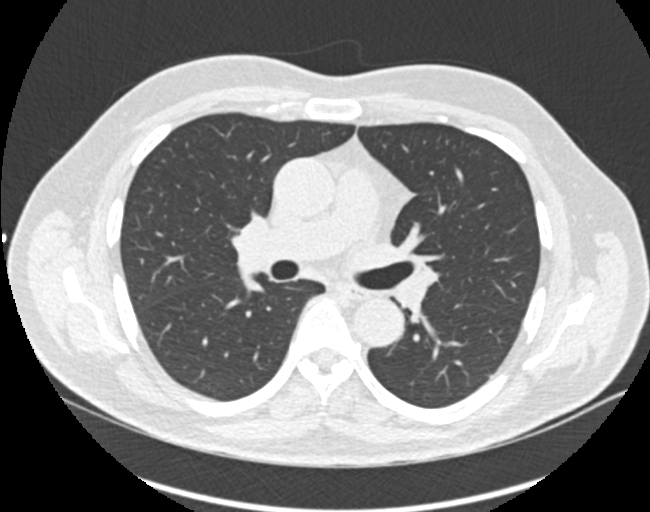

[Series 9: calcium scoring 2.00 br60 bestdiast 69% lungs · axial · 0.57mm/px · z∈[+1622,+1714]mm · 5 of 70 slices shown]
[im 12/70  vessel]
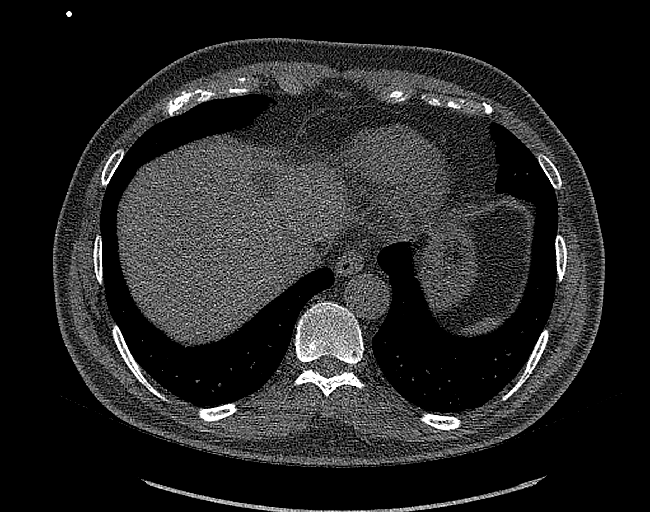
[im 24/70  vessel]
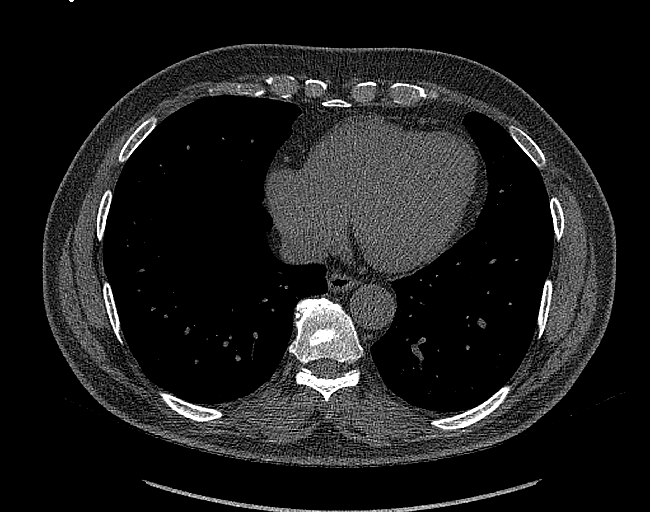
[im 35/70  vessel]
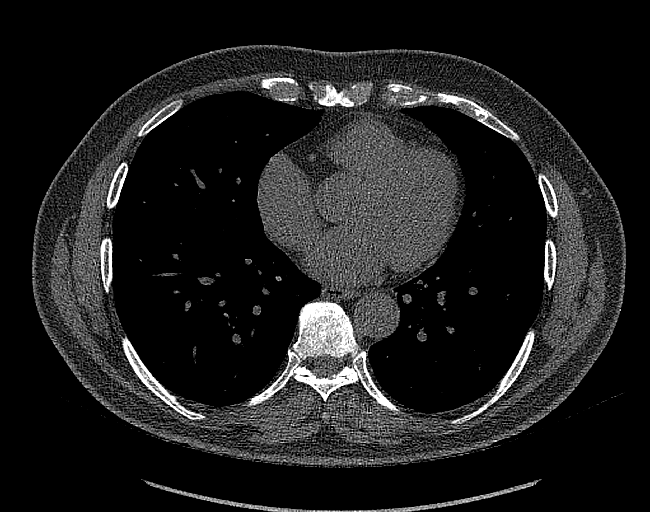
[im 47/70  vessel]
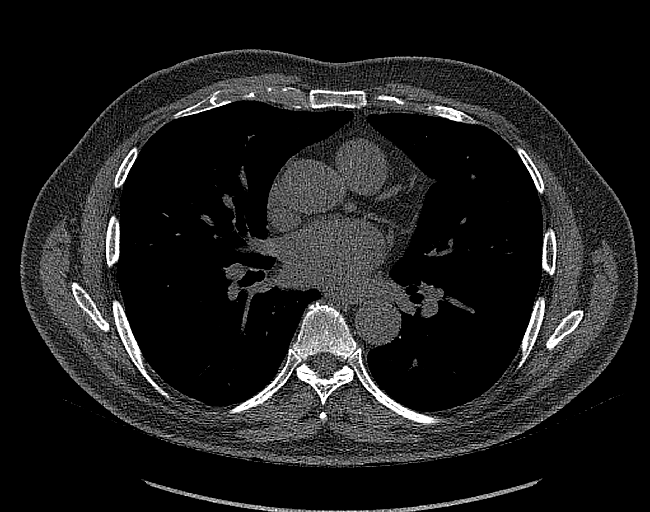
[im 58/70  vessel]
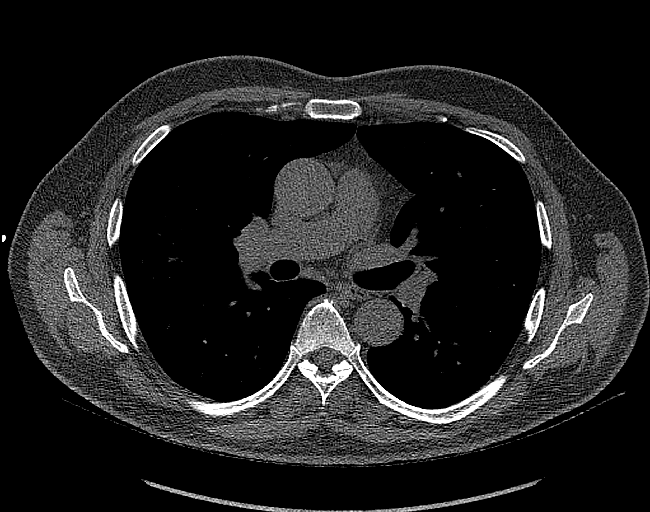

[14 of 20 positions shown; findings below may reference images not displayed]

FINDINGS: CORONARY CALCIUM SCORES:

Left Main: 0

LAD: 0

LCx: 0

RCA: 0

Total Agatston Score: 0

[HOSPITAL] percentile: 0

AORTA MEASUREMENTS:

Ascending Aorta: 31 mm

Descending Aorta: 25 mm

OTHER FINDINGS:


## 2022-09-04 NOTE — Progress Notes (Signed)
 Hand, Upper Extremity and Microvascular Surgery   [name], M.D. Associate Professor of Orthopedic Surgery Office: [phone]   NEW PATIENT CLINIC VISIT  Referred By: Self No address on file   Reason for Visit:    S/p right ring ringer partial fasciectomy for Dupuytren's contracture (DOS: 09/25/2021)  Prior History:  08/22/21: This is a 67 year old male presents today for second opinion regarding Dupuytren's contracture of the right ring finger.  The patient states that this started approximately 3 years ago and has worsened over time.  He primarily notes contracture involving the MCP of the right ring finger with a slight degree of contracture involving the PIP.  The patient is right-hand dominant and works as a Airline pilot at [employer].  He states that the contractures progressed to a degree which interferes with his ability to appropriately grip and grasp objects and also can be painful at times.  He presented to [facility] for evaluation by orthopedic hand surgeon where options including collagenase injection and a needle aponeurotomy were presented.  He presented today for second opinion in case he would like to transition his care to [facility].  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  Answers for HPI/ROS submitted by the patient on 08/22/2021 How did your symptoms begin?: gradually without injury How long ago did your symptoms begin?: 6 Years Please indicate all symptoms related to your issue: pain, swelling Where is your pain located? (select all that apply): right ring finger Since the onset of your problem, has the pain improved, worsened, or stayed the same?: getting worse Do your symptoms wake you from sleep?: No  10/08/21: Per Elisa Cap, PA: [name] is seen in the office today for a suture removal.  He  appears to be doing well.  He denies any drainage or difficulty with the incision. He denies any fever, chills, nausea or vomiting.   11/07/2021: Albert Wolfe is seen today for postoperative follow-up. He has been doing formal PT/OT and has been wearing his finger splint at night. In the morning, he states that his finger is able to reach almost complete extension, but it contracts over the course of the day and he begins to loose some active extension in this finger. Otherwise, he denies any pain, and has not had fevers or chills.   01/23/2022 Albert Wolfe presents today in routine follow up. Overall he is doing better. The injection we gave him last time help improve motion in the hand. He has nearly full ROM of the fingers at this time. He has slight residual stiffness. He is overall happy with his outcome thus far.   Interval History: 09/04/2022 Albert Wolfe is a 67 year old patient who is status post right ring finger partial palmar fasciectomy for Dupuytren's contracture on 09/24/2021.  He is here for routine follow-up.  Overall he is doing well.  He reports his hand is better than prior to the surgery.  He has achieved nearly full extension of the finger and has achieved nearly full flexion of the finger.  He has a couple of concerns.  First he wants to know if he should continue wearing his splint at night.  Second he reports that in the cold weather sometimes his finger goes white and he has to warm it up to get the color back in the finger.  Otherwise he is  happy with his outcome.  Past Medical History: Past Medical History:  Diagnosis Date  . Preoperative evaluation to rule out surgical contraindication 09/18/2021   Past Surgical History:  Procedure Laterality Date  . Anal fissure repair   1993   In [place]  . COLONOSCOPY  2009  . FASCIECTOMY PALMAR FASCIA Right 09/25/2021   Procedure: Right ring finger partial palmar fasciectomy;  Surgeon: Janece Norman Standing, MD;  Location:  [facility];  Service: Orthopedics;  Laterality: Right;   Family History  Problem Relation Age of Onset  . Anesthesia problems Neg Hx    Social History   Socioeconomic History  . Marital status: Married  . Number of children: 2  Occupational History  . Occupation: Professor of Retail buyer at [employer]  Tobacco Use  . Smoking status: Never  . Smokeless tobacco: Never  Vaping Use  . Vaping status: Never Used  Substance and Sexual Activity  . Alcohol use: Not Currently  . Drug use: Never  . Sexual activity: Yes    Partners: Female   Current Outpatient Medications  Medication Sig Dispense Refill  . Saccharomyces boulardii (FLORASTOR) 250 mg capsule Take by mouth 2 (two) times daily    . apixaban (ELIQUIS) 5 mg tablet Take 5 mg by mouth 2 (two) times daily    . bacitracin zinc-polymyxin B (POLYSPORIN) ointment Apply topically    . Bifidobacterium infantis (ALIGN ORAL) Take 1 tablet by mouth at bedtime    . clobetasoL (TEMOVATE) 0.05 % cream Apply 1 Application topically once daily as needed    . famotidine (PEPCID) 20 MG tablet Take 20 mg by mouth every morning    . famotidine (PEPCID) 20 MG tablet Take 20 mg by mouth    . ondansetron (ZOFRAN-ODT) 4 MG disintegrating tablet Take 1 tablet by mouth every 8 (eight) hours as needed    . triamcinolone 0.1 % cream APPLY TOPICALLY TO LOWER LEG TWICE DAILY FOR 14 DAYS AS NEEDED    . warfarin (COUMADIN ) 5 MG tablet TAKE 1 TABLET BY MOUTH ONCE DAILY EXCEPT ON MONDAY AND FRIDAY TAKE 1 1/2     No current facility-administered medications for this visit.   Allergies  Allergen Reactions  . Erythromycin Diarrhea and Other (See Comments)  . Erythromycin Base Unknown  . Ibuprofen Diarrhea and Other (See Comments)    Ibuprofen causes gastric upset. Ibuprofen causes gastric upset.   . Naproxen Other (See Comments)    Focused Physical Examination:  There were no vitals taken for this  visit.  A&Ox3 NAD  RUE: Palmar incision is well-healed  No tenderness to palpation over the palm or fingers. Painless and full PROM/AROM of the wrist AROM of ring finger PIP (5/100), DIP (0/45); PROM  PIP (0/100), DIP (0/75) - Full AROM/PROM of the MCP Sensation intact to light touch in the median, ulnar, and radial nerve distributions 5/5 motor EPL/FPL/FDS/FDP/EDC/IO/Thenars 2+RP  LUE: Skin CDI No obvious deformity of the shoulder, arm, elbow, forearm, wrist, hand  No tenderness to palpation at the distal radius/ulna and wrist No tenderness to palpation over the palm Painless and full PROM/AROM of the wrist Painless and full PROM/AROM of fingers Sensation intact to light touch in the median, ulnar, and radial nerve distributions 5/5 motor EPL/FPL/FDS/FDP/EDC/IO/Thenars 2+RP    Assessment and Plan:   ICD-10-CM  1. Dupuytren's contracture of right hand  M72.0    This is a 67 year old male who is almost 1 year status post partial fasciectomy of the  ring finger. His surgical incision is well-healed without signs of infection. He is progressing well.   At this point I think that he is probably peaked in terms of his motion.  This is a pretty good outcome for Dupuytren's contracture.  He would like to try some more therapy to see if he can get any more motion out of it and I think that is reasonable.  I will give him a prescription.  In terms of the cold intolerance, this is probably because we had to dissect out his nerves from proximal to distal in order to achieve safe excision of the Dupuytren's disease.  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  This is likely to get better over time.  I would recommend keeping an eye on this and letting us  know if it worsens.  I would imagine that over the next few years he will probably notice this is not as troubling.  Obviously he should keep the finger warm whenever possible.  He will follow-up  as needed.  I confirm that I was present for the key and critical portions of the service, including a review of the patient's history and other pertinent data.  I personally examined the patient, and formulated the evaluation and/or treatment plan.  I have reviewed the note and agree with the findings documented in the note, with any exceptions as noted below.      [name], M.D. Associate Professor of Orthopedic Surgery [facility]
